# Patient Record
Sex: Female | Born: 1980 | Race: White | Hispanic: No | Smoking: Former smoker
Health system: Southern US, Community
[De-identification: ages and names within clinical notes are randomized; demographics above are authoritative.]

## PROBLEM LIST (undated history)

## (undated) DIAGNOSIS — G43909 Migraine, unspecified, not intractable, without status migrainosus: Secondary | ICD-10-CM

## (undated) DIAGNOSIS — I7090 Unspecified atherosclerosis: Secondary | ICD-10-CM

## (undated) DIAGNOSIS — G4730 Sleep apnea, unspecified: Secondary | ICD-10-CM

## (undated) DIAGNOSIS — K219 Gastro-esophageal reflux disease without esophagitis: Secondary | ICD-10-CM

## (undated) HISTORY — DX: Unspecified atherosclerosis: I70.90

## (undated) HISTORY — DX: Migraine, unspecified, not intractable, without status migrainosus: G43.909

## (undated) HISTORY — PX: CHOLECYSTECTOMY: SHX55

## (undated) HISTORY — DX: Sleep apnea, unspecified: G47.30

## (undated) HISTORY — PX: TUBAL LIGATION: SHX77

## (undated) HISTORY — DX: Gastro-esophageal reflux disease without esophagitis: K21.9

---

## 2003-12-24 HISTORY — PX: CHOLECYSTECTOMY: SHX55

## 2004-12-23 HISTORY — PX: TUBAL LIGATION: SHX77

## 2016-07-18 DIAGNOSIS — K219 Gastro-esophageal reflux disease without esophagitis: Secondary | ICD-10-CM | POA: Insufficient documentation

## 2016-07-18 DIAGNOSIS — I10 Essential (primary) hypertension: Secondary | ICD-10-CM | POA: Insufficient documentation

## 2016-07-18 DIAGNOSIS — G43119 Migraine with aura, intractable, without status migrainosus: Secondary | ICD-10-CM

## 2016-07-18 DIAGNOSIS — N920 Excessive and frequent menstruation with regular cycle: Secondary | ICD-10-CM

## 2016-07-18 DIAGNOSIS — F17200 Nicotine dependence, unspecified, uncomplicated: Secondary | ICD-10-CM | POA: Insufficient documentation

## 2016-07-18 HISTORY — DX: Essential (primary) hypertension: I10

## 2016-07-18 HISTORY — DX: Excessive and frequent menstruation with regular cycle: N92.0

## 2016-07-18 HISTORY — DX: Migraine with aura, intractable, without status migrainosus: G43.119

## 2016-07-18 HISTORY — DX: Gastro-esophageal reflux disease without esophagitis: K21.9

## 2016-09-03 DIAGNOSIS — M5136 Other intervertebral disc degeneration, lumbar region: Secondary | ICD-10-CM | POA: Insufficient documentation

## 2016-09-03 DIAGNOSIS — M51369 Other intervertebral disc degeneration, lumbar region without mention of lumbar back pain or lower extremity pain: Secondary | ICD-10-CM | POA: Insufficient documentation

## 2016-09-03 HISTORY — DX: Other intervertebral disc degeneration, lumbar region: M51.36

## 2016-09-03 HISTORY — DX: Other intervertebral disc degeneration, lumbar region without mention of lumbar back pain or lower extremity pain: M51.369

## 2016-10-04 DIAGNOSIS — F419 Anxiety disorder, unspecified: Secondary | ICD-10-CM

## 2016-10-04 HISTORY — DX: Anxiety disorder, unspecified: F41.9

## 2019-04-20 ENCOUNTER — Telehealth: Payer: Self-pay | Admitting: *Deleted

## 2019-04-20 ENCOUNTER — Telehealth: Payer: Self-pay | Admitting: Cardiology

## 2019-04-20 NOTE — Telephone Encounter (Signed)

## 2019-04-20 NOTE — Telephone Encounter (Signed)
Virtual Visit Pre-Appointment Phone Call  "(Name), I am calling you today to discuss your upcoming appointment. We are currently trying to limit exposure to the virus that causes COVID-19 by seeing patients at home rather than in the office."  1. "What is the BEST phone number to call the day of the visit?" - include this in appointment notes  2. Do you have or have access to (through a family member/friend) a smartphone with video capability that we can use for your visit?" a. If yes - list this number in appt notes as cell (if different from BEST phone #) and list the appointment type as a VIDEO visit in appointment notes b. If no - list the appointment type as a PHONE visit in appointment notes  3. Confirm consent - "In the setting of the current Covid19 crisis, you are scheduled for a (phone or video) visit with your provider on (date) at (time).  Just as we do with many in-office visits, in order for you to participate in this visit, we must obtain consent.  If you'd like, I can send this to your mychart (if signed up) or email for you to review.  Otherwise, I can obtain your verbal consent now.  All virtual visits are billed to your insurance company just like a normal visit would be.  By agreeing to a virtual visit, we'd like you to understand that the technology does not allow for your provider to perform an examination, and thus may limit your provider's ability to fully assess your condition. If your provider identifies any concerns that need to be evaluated in person, we will make arrangements to do so.  Finally, though the technology is pretty good, we cannot assure that it will always work on either your or our end, and in the setting of a video visit, we may have to convert it to a phone-only visit.  In either situation, we cannot ensure that we have a secure connection.  Are you willing to proceed?" STAFF: Did the patient verbally acknowledge consent to telehealth visit? Document  YES/NO here: YES  4. Advise patient to be prepared - "Two hours prior to your appointment, go ahead and check your blood pressure, pulse, oxygen saturation, and your weight (if you have the equipment to check those) and write them all down. When your visit starts, your provider will ask you for this information. If you have an Apple Watch or Kardia device, please plan to have heart rate information ready on the day of your appointment. Please have a pen and paper handy nearby the day of the visit as well."  5. Give patient instructions for MyChart download to smartphone OR Doximity/Doxy.me as below if video visit (depending on what platform provider is using)  6. Inform patient they will receive a phone call 15 minutes prior to their appointment time (may be from unknown caller ID) so they should be prepared to answer    TELEPHONE CALL NOTE  Marie Cox has been deemed a candidate for a follow-up tele-health visit to limit community exposure during the Covid-19 pandemic. I spoke with the patient via phone to ensure availability of phone/video source, confirm preferred email & phone number, and discuss instructions and expectations.  I reminded Marie Cox to be prepared with any vital sign and/or heart rhythm information that could potentially be obtained via home monitoring, at the time of her visit. I reminded Marie Cox to expect a phone call prior to her visit.  Minette BrineLisa B Welch 04/20/2019 10:14 AM   INSTRUCTIONS FOR DOWNLOADING THE MYCHART APP TO SMARTPHONE  - The patient must first make sure to have activated MyChart and know their login information - If Apple, go to [employer] Store and type in MyChart in the search bar and download the app. If Android, ask patient to go to [employer] Play Store and type in HoffmanMyChart in the search bar and download the app. The app is free but as with any other app downloads, their phone may require them to verify saved payment information or  Apple/Android password.  - The patient will need to then log into the app with their MyChart username and password, and select [facility] as their healthcare provider to link the account. When it is time for your visit, go to the MyChart app, find appointments, and click Begin Video Visit. Be sure to Select Allow for your device to access the Microphone and Camera for your visit. You will then be connected, and your provider will be with you shortly.  **If they have any issues connecting, or need assistance please contact MyChart service desk [phone])**  **If using a computer, in order to ensure the best quality for their visit they will need to use either of the following Internet Browsers: [employer] Edge, or Google Chrome**  IF USING DOXIMITY or DOXY.ME - The patient will receive a link just prior to their visit by text.     FULL LENGTH CONSENT FOR TELE-HEALTH VISIT   I hereby voluntarily request, consent and authorize [facility] and its employed or contracted physicians, physician assistants, nurse practitioners or other licensed health care professionals (the Practitioner), to provide me with telemedicine health care services (the Services") as deemed necessary by the treating Practitioner. I acknowledge and consent to receive the Services by the Practitioner via telemedicine. I understand that the telemedicine visit will involve communicating with the Practitioner through live audiovisual communication technology and the disclosure of certain medical information by electronic transmission. I acknowledge that I have been given the opportunity to request an in-person assessment or other available alternative prior to the telemedicine visit and am voluntarily participating in the telemedicine visit.  I understand that I have the right to withhold or withdraw my consent to the use of telemedicine in the course of my care at any time, without affecting my right to future care  or treatment, and that the Practitioner or I may terminate the telemedicine visit at any time. I understand that I have the right to inspect all information obtained and/or recorded in the course of the telemedicine visit and may receive copies of available information for a reasonable fee.  I understand that some of the potential risks of receiving the Services via telemedicine include:   Delay or interruption in medical evaluation due to technological equipment failure or disruption;  Information transmitted may not be sufficient (e.g. poor resolution of images) to allow for appropriate medical decision making by the Practitioner; and/or   In rare instances, security protocols could fail, causing a breach of personal health information.  Furthermore, I acknowledge that it is my responsibility to provide information about my medical history, conditions and care that is complete and accurate to the best of my ability. I acknowledge that Practitioner's advice, recommendations, and/or decision may be based on factors not within their control, such as incomplete or inaccurate data provided by me or distortions of diagnostic images or specimens that may result from electronic transmissions. I understand that the  practice of medicine is not an Chief Strategy Officer and that Practitioner makes no warranties or guarantees regarding treatment outcomes. I acknowledge that I will receive a copy of this consent concurrently upon execution via email to the email address I last provided but may also request a printed copy by calling the office of [facility].    I understand that my insurance will be billed for this visit.   I have read or had this consent read to me.  I understand the contents of this consent, which adequately explains the benefits and risks of the Services being provided via telemedicine.   I have been provided ample opportunity to ask questions regarding this consent and the Services and have had  my questions answered to my satisfaction.  I give my informed consent for the services to be provided through the use of telemedicine in my medical care  By participating in this telemedicine visit I agree to the above.

## 2019-04-22 ENCOUNTER — Other Ambulatory Visit: Payer: Self-pay

## 2019-04-22 ENCOUNTER — Encounter: Payer: Self-pay | Admitting: Cardiology

## 2019-04-22 ENCOUNTER — Telehealth (INDEPENDENT_AMBULATORY_CARE_PROVIDER_SITE_OTHER): Payer: Self-pay | Admitting: Cardiology

## 2019-04-22 VITALS — Ht 70.0 in | Wt 242.0 lb

## 2019-04-22 DIAGNOSIS — R079 Chest pain, unspecified: Secondary | ICD-10-CM

## 2019-04-22 DIAGNOSIS — I10 Essential (primary) hypertension: Secondary | ICD-10-CM

## 2019-04-22 DIAGNOSIS — F17200 Nicotine dependence, unspecified, uncomplicated: Secondary | ICD-10-CM

## 2019-04-22 DIAGNOSIS — Z01812 Encounter for preprocedural laboratory examination: Secondary | ICD-10-CM

## 2019-04-22 NOTE — Addendum Note (Signed)
Addended by: Pamala Hurry on: 04/22/2019 09:48 AM   Modules accepted: Orders

## 2019-04-22 NOTE — Progress Notes (Signed)
Addendum: In view of her chest discomfort symptoms I will set her up for treadmill stress test in 6 to 8 weeks.  I do not see any rush to do this in view of the corona pandemic.  Echocardiogram will be done in view of the low voltage EKG which may be simply because of her body habitus issues.

## 2019-04-22 NOTE — Progress Notes (Signed)
Virtual Visit via Video Note   This visit type was conducted due to national recommendations for restrictions regarding the COVID-19 Pandemic (e.g. social distancing) in an effort to limit this patient's exposure and mitigate transmission in our community.  Due to her co-morbid illnesses, this patient is at least at moderate risk for complications without adequate follow up.  This format is felt to be most appropriate for this patient at this time.  All issues noted in this document were discussed and addressed.  A limited physical exam was performed with this format.  Please refer to the patient's chart for her consent to telehealth for [facility].   Evaluation Performed:  Follow-up visit  Date:  04/22/2019   ID:  Marie Cox, Marie Cox 19-Sep-1981, MRN [id number]  Patient Location: Other:  work Provider Location: Home  PCP:  Baldo Ash, FNP  Cardiologist:  No primary care provider on file.  Electrophysiologist:  None   Chief Complaint:  Abnormal EKG  History of Present Illness:    Marie Cox is a 38 y.o. female with past medical history of essential hypertension.  She mentions to me now that her blood pressure is stable without any medications.  She has been experiencing some chest discomfort.  She mentions to me that it is more like a stabbing sensation at times at different parts of the chest and not related to exertion.  No orthopnea or PND.  She leads a sedentary lifestyle.  Sexual activity does not aggravate or bring around the symptoms.  At the time of my evaluation, the patient is alert awake oriented and in no distress.  The patient does not have symptoms concerning for COVID-19 infection (fever, chills, cough, or new shortness of breath).    Past Medical History:  Diagnosis Date  . Migraines    Past Surgical History:  Procedure Laterality Date  . CHOLECYSTECTOMY    . TUBAL LIGATION       No outpatient medications have been marked as taking for the  04/22/19 encounter (Telemedicine) with Yordin Rhoda, Aundra Dubin, MD.     Allergies:   Morphine and related   Social History   Tobacco Use  . Smoking status: Current Every Day Smoker    Types: Cigarettes  . Smokeless tobacco: Never Used  Substance Use Topics  . Alcohol use: Never    Frequency: Never  . Drug use: Never     Family Hx: The patient's family history includes Diabetes in her mother; Heart Problems in her father; Heart attack in her maternal grandfather and paternal grandmother; Hypertension in her mother; Lung cancer in her paternal grandfather.  ROS:   Please see the history of present illness.    As mentioned above All other systems reviewed and are negative.   Prior CV studies:   The following studies were reviewed today:  I reviewed records from primary care physician's office  Labs/Other Tests and Data Reviewed:    EKG:  An ECG dated April 11, 2019 was personally reviewed today and demonstrated:  Sinus rhythm and low voltage  Recent Labs: No results found for requested labs within last 8760 hours.   Recent Lipid Panel No results found for: CHOL, TRIG, HDL, CHOLHDL, LDLCALC, LDLDIRECT  Wt Readings from Last 3 Encounters:  04/22/19 242 lb (109.8 kg)     Objective:    Vital Signs:  Ht 5\' 10"  (1.778 m)   Wt 242 lb (109.8 kg)   BMI 34.72 kg/m    VITAL SIGNS:  reviewed  ASSESSMENT &  PLAN:    1. Chest discomfort: The patient's chest discomfort symptoms are atypical for coronary etiology.  Discussed this and reassured the patient about my findings.  I have asked her to walk 10 minutes a day at normal pace to do this.  If she has any symptoms she knows to stop.  She will go to the nearest emergency room for any concerning symptoms. 2. Essential hypertension: With lifestyle modification her blood pressure is stable 3. Cigarette smoking: I congratulated her about quitting 2 weeks ago.  I told her never to go back to smoking and she agrees 4. Overweight: Diet was  discussed losing weight stressed.  She vocalized understanding 5. Patient will be seen in follow-up appointment in 2 months or earlier if the patient has any concerns   COVID-19 Education: The signs and symptoms of COVID-19 were discussed with the patient and how to seek care for testing (follow up with PCP or arrange E-visit).  The importance of social distancing was discussed today.  Time:   Today, I have spent  minutes with the patient with telehealth technology discussing the above problems.     Medication Adjustments/Labs and Tests Ordered: Current medicines are reviewed at length with the patient today.  Concerns regarding medicines are outlined above.   Tests Ordered: No orders of the defined types were placed in this encounter.   Medication Changes: No orders of the defined types were placed in this encounter.   Disposition:  Follow up in 2 month(s)  Signed, Garwin Brothersajan R Franny Selvage, MD  04/22/2019 9:17 AM    [facility]

## 2019-04-22 NOTE — Patient Instructions (Addendum)
Medication Instructions:  Your physician recommends that you continue on your current medications as directed. Please refer to the Current Medication list given to you today.  If you need a refill on your cardiac medications before your next appointment, please call your pharmacy.   Lab work: NONE If you have labs (blood work) drawn today and your tests are completely normal, you will receive your results only by: Marland Kitchen MyChart Message (if you have MyChart) OR . A paper copy in the mail If you have any lab test that is abnormal or we need to change your treatment, we will call you to review the results.  Testing/Procedures: Your physician has requested that you have a stress echocardiogram. For further information please visit [email or web address] Please follow instruction sheet as given.  April 22, 2019      Marie Cox DOB: 1981/11/04 MRN: [id number] [address] [place] [address]   Dear [name],   You will be called to schedule an Exercise Stress Test  Please arrive 15 minutes prior to your appointment time for registration and insurance purposes.  The test will take approximately 45 minutes to complete.  How to prepare for your Exercise Stress Test: . Do bring a list of your current medications with you.  If not listed below, you may take your medications as normal. . Do wear comfortable clothes (no dresses or overalls) and walking shoes, tennis shoes preferred (no heels or open toed shoes are allowed) . Do Not wear cologne, perfume, aftershave or lotions (deodorant is allowed). . Please report to [address], [place], [place] for your test.  If these instructions are not followed, your test will have to be rescheduled.  If you have questions or concerns about your appointment, you can call [phone] If you cannot keep your appointment, please provide 24 hours notification to the Stress Lab, to avoid a possible $50 charge to your  account.  Follow-Up: At [facility], you and your health needs are our priority.  As part of our continuing mission to provide you with exceptional heart care, we have created designated Provider Care Teams.  These Care Teams include your primary Cardiologist (physician) and Advanced Practice Providers (APPs -  Physician Assistants and Nurse Practitioners) who all work together to provide you with the care you need, when you need it. You will need a follow up appointment in 2 months.    Any Other Special Instructions Will Be Listed Below    Exercise Stress Echocardiogram  An exercise stress echocardiogram is a test that checks how well your heart is working. For this test, you will walk on a treadmill to make your heart beat faster. This test uses sound waves (ultrasound) and a computer to make pictures (images) of your heart. These pictures will be taken before you exercise and after you exercise. What happens before the procedure?  Follow instructions from your doctor about what you cannot eat or drink before the test.  Do not drink or eat anything that has caffeine in it. Stop having caffeine for 24 hours before the test.  Ask your doctor about changing or stopping your normal medicines. This is important if you take diabetes medicines or blood thinners. Ask your doctor if you should take your medicines with water before the test.  If you use an inhaler, bring it to the test.  Do not use any products that have nicotine or tobacco in them, such as cigarettes and e-cigarettes. Stop using them for 4 hours  before the test. If you need help quitting, ask your doctor.  Wear comfortable shoes and clothing. What happens during the procedure?  You will be hooked up to a TV screen. Your doctor will watch the screen to see how fast your heart beats during the test.  Before you exercise, a computer will make a picture of your heart. To do this: ? A gel will be put on your chest. ? A wand  will be moved over the gel. ? Sound waves from the wand will go to the computer to make the picture.  Your will start walking on a treadmill. The treadmill will start at a slow speed. It will get faster a little bit at a time. When you walk faster, your heart will beat faster.  The treadmill will be stopped when your heart is working hard.  You will lie down right away so another picture of your heart can be taken.  The test will take 30-60 minutes. What happens after the procedure?  Your heart rate and blood pressure will be watched after the test.  If your doctor says that you can, you may: ? Eat what you usually eat. ? Do your normal activities. ? Take medicines like normal. Summary  An exercise stress echocardiogram is a test that checks how well your heart is working.  Follow instructions about what you cannot eat or drink before the test. Ask your doctor if you should take your normal medicines before the test.  Stop having caffeine for 24 hours before the test. Do not use anything with nicotine or tobacco in it for 4 hours before the test.  A computer will take a picture of your heart before you walk on a treadmill. It will take another picture when you are done walking.  Your heart rate and blood pressure will be watched after the test. This information is not intended to replace advice given to you by your health care provider. Make sure you discuss any questions you have with your health care provider. Document Released: 10/06/2009 Document Revised: 09/01/2016 Document Reviewed: 09/01/2016 Elsevier Interactive Patient Education  2019 [employer]

## 2019-06-01 ENCOUNTER — Telehealth: Payer: Self-pay | Admitting: Cardiology

## 2019-06-01 NOTE — Telephone Encounter (Signed)
Patient called and states that she was in [facility] and she has dome EKGS doen but no cardiac testing, she just wanted [place] to know, she is still pending a stress echo in the office.

## 2019-06-04 NOTE — Telephone Encounter (Signed)
Please change her stress echo to [place].  Please note that in a woman of childbearing age she will need pregnancy test evaluation before this test since it involves radiation.

## 2019-06-04 NOTE — Telephone Encounter (Signed)
Do you want patient to come in for HCG blood or urine pregnancy sample prior to lexi?

## 2019-06-04 NOTE — Addendum Note (Signed)
Addended by: Beckey Rutter on: 06/04/2019 05:03 PM   Modules accepted: Orders

## 2019-06-04 NOTE — Telephone Encounter (Signed)
Yes unless she has some form of contraception like intrauterine device

## 2019-06-07 NOTE — Telephone Encounter (Signed)
Urine is fine

## 2019-06-08 NOTE — Addendum Note (Signed)
Addended by: Beckey Rutter on: 06/08/2019 08:59 AM   Modules accepted: Orders

## 2019-06-09 ENCOUNTER — Telehealth: Payer: Self-pay | Admitting: *Deleted

## 2019-06-09 NOTE — Telephone Encounter (Signed)
Pt is anxious about the nuclear test and would like to wait until we can do treadmill if that is okay with you,. Please advise.

## 2019-06-09 NOTE — Telephone Encounter (Signed)
Please advise patient to walk on a daily basis and let us know if there are any symptoms.  Not to stress herself too much during walking just a regular walk.

## 2019-06-11 NOTE — Addendum Note (Signed)
Addended by: Beckey Rutter on: 06/11/2019 09:22 AM   Modules accepted: Orders

## 2019-06-11 NOTE — Telephone Encounter (Signed)
Patient states she has been walking daily and happy that she is back on waitlist for echo stress test. No concerns at this time, scheduled for f/u visit on 06/21/19 will discuss further with Dr. Docia Furl.

## 2019-06-21 ENCOUNTER — Other Ambulatory Visit: Payer: Self-pay

## 2019-06-21 ENCOUNTER — Telehealth: Payer: Self-pay | Admitting: Cardiology

## 2019-06-24 ENCOUNTER — Encounter: Payer: Self-pay | Admitting: Cardiology

## 2019-06-24 ENCOUNTER — Other Ambulatory Visit: Payer: Self-pay

## 2019-06-24 ENCOUNTER — Ambulatory Visit (INDEPENDENT_AMBULATORY_CARE_PROVIDER_SITE_OTHER): Payer: Self-pay | Admitting: Cardiology

## 2019-06-24 VITALS — BP 118/62 | HR 80 | Ht 70.0 in | Wt 235.0 lb

## 2019-06-24 DIAGNOSIS — R0789 Other chest pain: Secondary | ICD-10-CM

## 2019-06-24 DIAGNOSIS — R079 Chest pain, unspecified: Secondary | ICD-10-CM

## 2019-06-24 DIAGNOSIS — Z13220 Encounter for screening for lipoid disorders: Secondary | ICD-10-CM

## 2019-06-24 DIAGNOSIS — I10 Essential (primary) hypertension: Secondary | ICD-10-CM

## 2019-06-24 HISTORY — DX: Other chest pain: R07.89

## 2019-06-24 LAB — LIPID PANEL
Chol/HDL Ratio: 4 ratio (ref 0.0–4.4)
Cholesterol, Total: 167 mg/dL (ref 100–199)
HDL: 42 mg/dL (ref >39)
LDL Calculated: 85 mg/dL (ref 0–99)
Triglycerides: 202 mg/dL — ABNORMAL HIGH (ref 0–149)
VLDL Cholesterol Cal: 40 mg/dL (ref 5–40)

## 2019-06-24 NOTE — Addendum Note (Signed)
Addended by: Beckey Rutter on: 06/24/2019 08:46 AM   Modules accepted: Orders

## 2019-06-24 NOTE — Patient Instructions (Addendum)
Medication Instructions:  Your physician recommends that you continue on your current medications as directed. Please refer to the Current Medication list given to you today.  If you need a refill on your cardiac medications before your next appointment, please call your pharmacy.   Lab work: Your physician recommends that you have a lipid panel performed today.   If you have labs (blood work) drawn today and your tests are completely normal, you will receive your results only by: Marland Kitchen MyChart Message (if you have MyChart) OR . A paper copy in the mail If you have any lab test that is abnormal or we need to change your treatment, we will call you to review the results.  Testing/Procedures: You have been scheduled for CT calcium score to be performed at our [place]. THIS PROCEDURE IS $150 out of pocket!  Follow-Up: At [facility], you and your health needs are our priority.  As part of our continuing mission to provide you with exceptional heart care, we have created designated Provider Care Teams.  These Care Teams include your primary Cardiologist (physician) and Advanced Practice Providers (APPs -  Physician Assistants and Nurse Practitioners) who all work together to provide you with the care you need, when you need it. You will need a follow up appointment in 6 months.     Any Other Special Instructions Will Be Listed Below   Coronary Calcium Scan A coronary calcium scan is an imaging test used to look for deposits of calcium and other fatty materials (plaques) in the inner lining of the blood vessels of the heart (coronary arteries). These deposits of calcium and plaques can partly clog and narrow the coronary arteries without producing any symptoms or warning signs. This puts a person at risk for a heart attack. This test can detect these deposits before symptoms develop. Tell a health care provider about:  Any allergies you have.  All medicines you are  taking, including vitamins, herbs, eye drops, creams, and over-the-counter medicines.  Any problems you or family members have had with anesthetic medicines.  Any blood disorders you have.  Any surgeries you have had.  Any medical conditions you have.  Whether you are pregnant or may be pregnant. What are the risks? Generally, this is a safe procedure. However, problems may occur, including:  Harm to a pregnant woman and her unborn baby. This test involves the use of radiation. Radiation exposure can be dangerous to a pregnant woman and her unborn baby. If you are pregnant, you generally should not have this procedure done.  Slight increase in the risk of cancer. This is because of the radiation involved in the test. What happens before the procedure? No preparation is needed for this procedure. What happens during the procedure?   You will undress and remove any jewelry around your neck or chest.  You will put on a hospital gown.  Sticky electrodes will be placed on your chest. The electrodes will be connected to an electrocardiogram (ECG) machine to record a tracing of the electrical activity of your heart.  A CT scanner will take pictures of your heart. During this time, you will be asked to lie still and hold your breath for 2-3 seconds while a picture of your heart is being taken. The procedure may vary among health care providers and hospitals. What happens after the procedure?  You can get dressed.  You can return to your normal activities.  It is up to  you to get the results of your test. Ask your health care provider, or the department that is doing the test, when your results will be ready. Summary  A coronary calcium scan is an imaging test used to look for deposits of calcium and other fatty materials (plaques) in the inner lining of the blood vessels of the heart (coronary arteries).  Generally, this is a safe procedure. Tell your health care provider if you are  pregnant or may be pregnant.  No preparation is needed for this procedure.  A CT scanner will take pictures of your heart.  You can return to your normal activities after the scan is done. This information is not intended to replace advice given to you by your health care provider. Make sure you discuss any questions you have with your health care provider. Document Released: 06/06/2008 Document Revised: 11/21/2017 Document Reviewed: 10/28/2016 Elsevier Patient Education  2020 [employer]

## 2019-06-24 NOTE — Progress Notes (Signed)
Cardiology Office Note:    Date:  06/24/2019   ID:  Marie Cox, DOB Mar 13, 1981, MRN [id number]  PCP:  Sarajane Jews, FNP  Cardiologist:  Jenean Lindau, MD   Referring MD: Sarajane Jews, FNP    ASSESSMENT:    1. Chest discomfort    PLAN:    In order of problems listed above:  1. Chest discomfort: This is resolved.  She is living active lifestyle with good exercise and effort tolerance and has completely quit smoking.  I do not see the need for a stress test at this time.  I discussed this with her at extensive length.  Because of her concern of family history she is concerned about her risk for coronary artery disease and we will do a calcium score.  We will also check her lipids today. 2. I congratulated her about her weight loss.Patient will be seen in follow-up appointment in 6 months or earlier if the patient has any concerns    Medication Adjustments/Labs and Tests Ordered: Current medicines are reviewed at length with the patient today.  Concerns regarding medicines are outlined above.  No orders of the defined types were placed in this encounter.  No orders of the defined types were placed in this encounter.    No chief complaint on file.    History of Present Illness:    Marie Cox is a 38 y.o. female.  Patient was evaluated by me for chest discomfort.  Fortunately she has quit smoking and she tells me that she walks 15 minutes a day twice a day at least without any problems.  No chest pain orthopnea or PND.  She is very happy about it.  She is very proactive with diet and has lost weight.  Her blood pressure is now stable.  She is not on any medications.  Past Medical History:  Diagnosis Date  . Migraines     Past Surgical History:  Procedure Laterality Date  . CHOLECYSTECTOMY    . TUBAL LIGATION      Current Medications: No outpatient medications have been marked as taking for the 06/24/19 encounter (Office Visit) with Elenor Wildes, Reita Cliche,  MD.     Allergies:   Morphine and related   Social History   Socioeconomic History  . Marital status: Single    Spouse name: Not on file  . Number of children: Not on file  . Years of education: Not on file  . Highest education level: Not on file  Occupational History  . Not on file  Social Needs  . Financial resource strain: Not on file  . Food insecurity    Worry: Not on file    Inability: Not on file  . Transportation needs    Medical: Not on file    Non-medical: Not on file  Tobacco Use  . Smoking status: Current Every Day Smoker    Types: Cigarettes  . Smokeless tobacco: Never Used  Substance and Sexual Activity  . Alcohol use: Never    Frequency: Never  . Drug use: Never  . Sexual activity: Not on file  Lifestyle  . Physical activity    Days per week: Not on file    Minutes per session: Not on file  . Stress: Not on file  Relationships  . Social Herbalist on phone: Not on file    Gets together: Not on file    Attends religious service: Not on file    Active member of club or  organization: Not on file    Attends meetings of clubs or organizations: Not on file    Relationship status: Not on file  Other Topics Concern  . Not on file  Social History Narrative  . Not on file     Family History: The patient's family history includes Diabetes in her mother; Heart Problems in her father; Heart attack in her maternal grandfather and paternal grandmother; Hypertension in her mother; Lung cancer in her paternal grandfather.  ROS:   Please see the history of present illness.    All other systems reviewed and are negative.  EKGs/Labs/Other Studies Reviewed:    The following studies were reviewed today: [facility] and I reviewed records extensively.  EKG reveals sinus rhythm and nonspecific ST-T segment changes and labs were also reviewed   Recent Labs: No results found for requested labs within last 8760 hours.  Recent Lipid  Panel No results found for: CHOL, TRIG, HDL, CHOLHDL, VLDL, LDLCALC, LDLDIRECT  Physical Exam:    VS:  BP 118/62 (BP Location: Left Arm, Patient Position: Sitting, Cuff Size: Normal)   Pulse 80   Ht 5\' 10"  (1.778 m)   Wt 235 lb (106.6 kg)   SpO2 99%   BMI 33.72 kg/m     Wt Readings from Last 3 Encounters:  06/24/19 235 lb (106.6 kg)  04/22/19 242 lb (109.8 kg)     GEN: Patient is in no acute distress HEENT: Normal NECK: No JVD; No carotid bruits LYMPHATICS: No lymphadenopathy CARDIAC: Hear sounds regular, 2/6 systolic murmur at the apex. RESPIRATORY:  Clear to auscultation without rales, wheezing or rhonchi  ABDOMEN: Soft, non-tender, non-distended MUSCULOSKELETAL:  No edema; No deformity  SKIN: Warm and dry NEUROLOGIC:  Alert and oriented x 3 PSYCHIATRIC:  Normal affect   Signed, Garwin Brothersajan R Tihanna Goodson, MD  06/24/2019 8:35 AM    [facility]

## 2019-07-22 ENCOUNTER — Telehealth: Payer: Self-pay | Admitting: Cardiology

## 2019-07-22 DIAGNOSIS — I10 Essential (primary) hypertension: Secondary | ICD-10-CM

## 2019-07-22 DIAGNOSIS — Z13220 Encounter for screening for lipoid disorders: Secondary | ICD-10-CM

## 2019-07-22 NOTE — Telephone Encounter (Signed)
Lab results relayed, repeat labs ordered to be drawn in Dec. 2020. No further questions at this time.

## 2019-07-22 NOTE — Telephone Encounter (Signed)
Please call with lab results 

## 2019-07-22 NOTE — Telephone Encounter (Signed)
Copy of 06/24/19 lipid results sent to Dr. Rockne Coons per Dr. Docia Furl request.

## 2019-07-28 ENCOUNTER — Other Ambulatory Visit: Payer: Self-pay

## 2019-07-28 ENCOUNTER — Ambulatory Visit (INDEPENDENT_AMBULATORY_CARE_PROVIDER_SITE_OTHER)
Admission: RE | Admit: 2019-07-28 | Discharge: 2019-07-28 | Disposition: A | Discharge status: Home / Self Care | Payer: Self-pay | Source: Ambulatory Visit | Attending: Cardiology | Admitting: Cardiology

## 2019-07-28 DIAGNOSIS — R079 Chest pain, unspecified: Secondary | ICD-10-CM

## 2019-09-03 ENCOUNTER — Telehealth: Payer: Self-pay

## 2019-09-03 DIAGNOSIS — R0789 Other chest pain: Secondary | ICD-10-CM

## 2019-09-03 DIAGNOSIS — I10 Essential (primary) hypertension: Secondary | ICD-10-CM

## 2019-09-03 NOTE — Telephone Encounter (Signed)
-----   Message from Jenean Lindau, MD sent at 08/22/2019  7:28 PM EDT ----- Calcium score was low.  Continue diet and exercise.  I would like to get a lipid check in the next month or 2.  Probably after 2 months.  Nodule seen on the chest.  Most likely unremarkable however I would like this to be followed by the patient's primary care physician.  Please let patient know and also call the physician's nurse and let them know that they will be following this.  Please send him a copy of this report and document it. Jenean Lindau, MD 08/22/2019 7:27 PM

## 2019-09-03 NOTE — Addendum Note (Signed)
Addended by: Beckey Rutter on: 09/03/2019 10:17 AM   Modules accepted: Orders

## 2019-09-03 NOTE — Telephone Encounter (Signed)
Informed of nodule found on CT and that a copy of result was faxed so Dr. Hassell Done may assess need for further testing. RN called patient and relayed information. She is in agreement with diet/exercise and will come back in 2 mo for repeat labs. Aware of nodule and will touch base with Dr. Earlie Server office later today or Monday.

## 2019-11-12 LAB — HEPATIC FUNCTION PANEL
ALT: 16 IU/L (ref 0–32)
AST: 14 IU/L (ref 0–40)
Albumin: 4.2 g/dL (ref 3.8–4.8)
Alkaline Phosphatase: 76 IU/L (ref 39–117)
Bilirubin Total: 0.9 mg/dL (ref 0.0–1.2)
Bilirubin, Direct: 0.21 mg/dL (ref 0.00–0.40)
Total Protein: 6.6 g/dL (ref 6.0–8.5)

## 2019-11-12 LAB — LIPID PANEL
Chol/HDL Ratio: 3.5 ratio (ref 0.0–4.4)
Cholesterol, Total: 185 mg/dL (ref 100–199)
HDL: 53 mg/dL (ref >39)
LDL Chol Calc (NIH): 113 mg/dL — ABNORMAL HIGH (ref 0–99)
Triglycerides: 104 mg/dL (ref 0–149)
VLDL Cholesterol Cal: 19 mg/dL (ref 5–40)

## 2019-11-23 DIAGNOSIS — R918 Other nonspecific abnormal finding of lung field: Secondary | ICD-10-CM | POA: Insufficient documentation

## 2019-11-23 HISTORY — DX: Other nonspecific abnormal finding of lung field: R91.8

## 2019-12-10 ENCOUNTER — Ambulatory Visit (INDEPENDENT_AMBULATORY_CARE_PROVIDER_SITE_OTHER): Payer: Self-pay | Admitting: Cardiology

## 2019-12-10 ENCOUNTER — Other Ambulatory Visit: Payer: Self-pay

## 2019-12-10 ENCOUNTER — Encounter: Payer: Self-pay | Admitting: Cardiology

## 2019-12-10 DIAGNOSIS — E785 Hyperlipidemia, unspecified: Secondary | ICD-10-CM | POA: Insufficient documentation

## 2019-12-10 DIAGNOSIS — Z87891 Personal history of nicotine dependence: Secondary | ICD-10-CM

## 2019-12-10 DIAGNOSIS — I2510 Atherosclerotic heart disease of native coronary artery without angina pectoris: Secondary | ICD-10-CM

## 2019-12-10 DIAGNOSIS — R931 Abnormal findings on diagnostic imaging of heart and coronary circulation: Secondary | ICD-10-CM | POA: Insufficient documentation

## 2019-12-10 DIAGNOSIS — I2584 Coronary atherosclerosis due to calcified coronary lesion: Secondary | ICD-10-CM

## 2019-12-10 DIAGNOSIS — E782 Mixed hyperlipidemia: Secondary | ICD-10-CM | POA: Insufficient documentation

## 2019-12-10 HISTORY — DX: Abnormal findings on diagnostic imaging of heart and coronary circulation: R93.1

## 2019-12-10 HISTORY — DX: Mixed hyperlipidemia: E78.2

## 2019-12-10 HISTORY — DX: Personal history of nicotine dependence: Z87.891

## 2019-12-10 HISTORY — DX: Atherosclerotic heart disease of native coronary artery without angina pectoris: I25.10

## 2019-12-10 MED ORDER — ATORVASTATIN CALCIUM 10 MG PO TABS: 10.0000 mg | ORAL_TABLET | Freq: Every day | ORAL | 2 refills | Status: DC

## 2019-12-10 NOTE — Patient Instructions (Addendum)
Medication Instructions:  Your physician has recommended you make the following change in your medication:   START: ATORVASTATIN 10 MG DAILY  *If you need a refill on your cardiac medications before your next appointment, please call your pharmacy*  Lab Work: Your physician recommends that you return for FASTING lab work in [place]  BMET LFT's Lipids  If you have labs (blood work) drawn today and your tests are completely normal, you will receive your results only by: Marland Kitchen MyChart Message (if you have MyChart) OR . A paper copy in the mail If you have any lab test that is abnormal or we need to change your treatment, we will call you to review the results.  Testing/Procedures: None Ordered  Follow-Up: At [facility], you and your health needs are our priority.  As part of our continuing mission to provide you with exceptional heart care, we have created designated Provider Care Teams.  These Care Teams include your primary Cardiologist (physician) and Advanced Practice Providers (APPs -  Physician Assistants and Nurse Practitioners) who all work together to provide you with the care you need, when you need it.  Your next appointment:   6 month(s)  The format for your next appointment:   In Person  Provider:   Jyl Heinz, MD  Other Instructions

## 2019-12-10 NOTE — Progress Notes (Signed)
Cardiology Office Note:    Date:  12/10/2019   ID:  Marie Cox, DOB 10-14-1981, MRN [id number]  PCP:  Sarajane Jews, FNP  Cardiologist:  Jenean Lindau, MD   Referring MD: Sarajane Jews, FNP    ASSESSMENT:    1. Agatston coronary artery calcium score less than 100   2. Coronary artery calcification   3. Ex-smoker    PLAN:    In order of problems listed above:  1. Coronary artery calcification on calcium scoring CT scan: Secondary prevention stressed with the patient.  Importance of compliance with diet and medication stressed and she vocalized understanding.  Importance of regular exercise stressed and weight reduction stressed.  Details of exercise and diet outlined to her at length and she vocalized understanding and promises to do better. 2. Mild dyslipidemia: She will be initiated on statins atorvastatin 10 mg daily.  Diet was discussed.  Liver lipid check in 6 weeks. 3. Ex-smoker: She promises to never go back to smoking. 4. Patient will be seen in follow-up appointment in 6 months or earlier if the patient has any concerns    Medication Adjustments/Labs and Tests Ordered: Current medicines are reviewed at length with the patient today.  Concerns regarding medicines are outlined above.  No orders of the defined types were placed in this encounter.  No orders of the defined types were placed in this encounter.    Chief Complaint  Patient presents with  . Follow-up     History of Present Illness:    Marie Cox is a 38 y.o. female.  Patient has past medical history of essential hypertension which is better now without medications.  She has done lifestyle modifications.  She denies any chest pain orthopnea or PND.  She has coronary calcifications on CT scoring.  She denies any problems with exertion.  She is quit smoking in April and she is very happy about it.  At the time of my evaluation, the patient is alert awake oriented and in no  distress.  Past Medical History:  Diagnosis Date  . Migraines     Past Surgical History:  Procedure Laterality Date  . CHOLECYSTECTOMY    . TUBAL LIGATION      Current Medications: No outpatient medications have been marked as taking for the 12/10/19 encounter (Office Visit) with Renly Guedes, Reita Cliche, MD.     Allergies:   Morphine and related   Social History   Socioeconomic History  . Marital status: Single    Spouse name: Not on file  . Number of children: Not on file  . Years of education: Not on file  . Highest education level: Not on file  Occupational History  . Not on file  Tobacco Use  . Smoking status: Former Smoker    Types: Cigarettes    Start date: 04/09/2019  . Smokeless tobacco: Never Used  Substance and Sexual Activity  . Alcohol use: Never  . Drug use: Never  . Sexual activity: Not on file  Other Topics Concern  . Not on file  Social History Narrative  . Not on file   Social Determinants of Health   Financial Resource Strain:   . Difficulty of Paying Living Expenses: Not on file  Food Insecurity:   . Worried About Charity fundraiser in the Last Year: Not on file  . Ran Out of Food in the Last Year: Not on file  Transportation Needs:   . Lack of Transportation (Medical): Not on file  .  Lack of Transportation (Non-Medical): Not on file  Physical Activity:   . Days of Exercise per Week: Not on file  . Minutes of Exercise per Session: Not on file  Stress:   . Feeling of Stress : Not on file  Social Connections:   . Frequency of Communication with Friends and Family: Not on file  . Frequency of Social Gatherings with Friends and Family: Not on file  . Attends Religious Services: Not on file  . Active Member of Clubs or Organizations: Not on file  . Attends Banker Meetings: Not on file  . Marital Status: Not on file     Family History: The patient's family history includes Diabetes in her mother; Heart Problems in her father;  Heart attack in her maternal grandfather and paternal grandmother; Hypertension in her mother; Lung cancer in her paternal grandfather.  ROS:   Please see the history of present illness.    All other systems reviewed and are negative.  EKGs/Labs/Other Studies Reviewed:    The following studies were reviewed today: *CT CARDIAC SCORING (Accession [id number]) (Order [id number]) Imaging Date: 06/24/2019 Department: [facility]  Ordering/Authorizing: Amerah Puleo, Aundra Dubin, MD  Exam Status  Status  Final [99]  PACS Intelerad Image Link  Show images for CT CARDIAC SCORING  Addendum  ADDENDUM REPORT: 07/28/2019 12:16  CLINICAL DATA:  Risk stratification  EXAM: Coronary Calcium Score  TECHNIQUE: The patient was scanned on a Siemens Somatom 64 slice scanner. Axial non-contrast 3 mm slices were carried out through the heart. The data set was analyzed on a dedicated work station and scored using the Agatson method.  FINDINGS: Non-cardiac: See separate report from [facility].  Ascending aorta: Normal diameter 3.2 cm  Pericardium: Normal  Coronary arteries: One isolated area of calcium seen in mid LAD  IMPRESSION: Coronary calcium score of 7. This was 53 th percentile for age and sex matched control.  Charlton Haws   Electronically Signed   By: Charlton Haws M.D.   On: 07/28/2019 12:16       Recent Labs: 11/11/2019: ALT 16  Recent Lipid Panel    Component Value Date/Time   CHOL 185 11/11/2019 0836   TRIG 104 11/11/2019 0836   HDL 53 11/11/2019 0836   CHOLHDL 3.5 11/11/2019 0836   LDLCALC 113 (H) 11/11/2019 0836    Physical Exam:    VS:  BP 110/62   Pulse 87   Ht 5\' 10"  (1.778 m)   Wt 250 lb 3.2 oz (113.5 kg)   SpO2 99%   BMI 35.90 kg/m     Wt Readings from Last 3 Encounters:  12/10/19 250 lb 3.2 oz (113.5 kg)  06/24/19 235 lb (106.6 kg)  04/22/19 242 lb (109.8 kg)     GEN: Patient is in no acute distress HEENT:  Normal NECK: No JVD; No carotid bruits LYMPHATICS: No lymphadenopathy CARDIAC: Hear sounds regular, 2/6 systolic murmur at the apex. RESPIRATORY:  Clear to auscultation without rales, wheezing or rhonchi  ABDOMEN: Soft, non-tender, non-distended MUSCULOSKELETAL:  No edema; No deformity  SKIN: Warm and dry NEUROLOGIC:  Alert and oriented x 3 PSYCHIATRIC:  Normal affect   Signed, 04/24/19, MD  12/10/2019 8:32 AM    [facility]

## 2019-12-23 ENCOUNTER — Telehealth: Payer: Self-pay | Admitting: Cardiology

## 2020-04-19 IMAGING — CT CT HEART SCORING
2 series · 16 of 20 positions shown, 18 images · non-contrast
Comparison: Chest radiograph 05/13/2019.  No prior chest CT.
COMPARISON: Chest radiograph 05/13/2019.  No prior chest CT.

Addendum:
EXAM:
OVER-READ INTERPRETATION  CT CHEST

The following report is an over-read performed by radiologist Dr.
Wilzor Bayalkoti [REDACTED] on 07/28/2019. This over-read
does not include interpretation of cardiac or coronary anatomy or
pathology. The calcium score interpretation by the cardiologist is
attached.
CLINICAL DATA: Risk stratification
Coronary Calcium Score
TECHNIQUE: The patient was scanned on a Siemens Somatom 64 slice scanner. Axial
non-contrast 3 mm slices were carried out through the heart. The
data set was analyzed on a dedicated work station and scored using
the Agatson method.

[Series 2: casc 3.0 i36f 2 bestdiast 72 % · axial · 0.39mm/px · z∈[-240,-146]mm · 8 of 41 slices shown, 10 images]
[im 5/41  vessel]
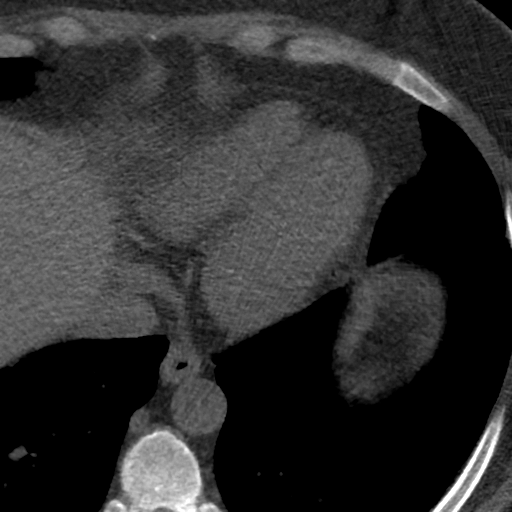
[im 5/41  lung]
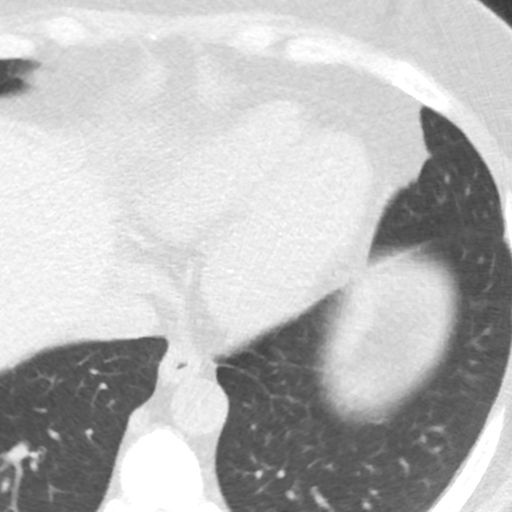
[im 9/41  vessel]
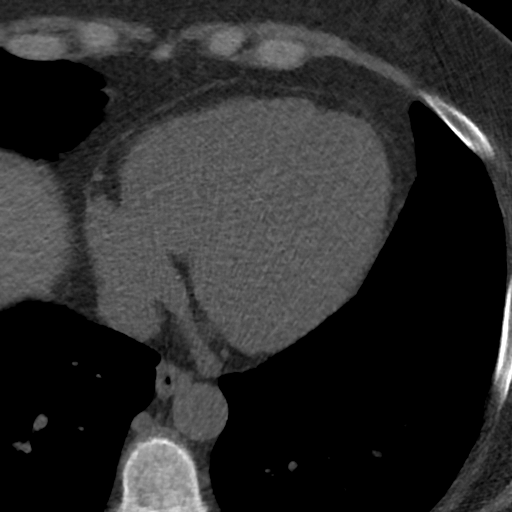
[im 14/41  vessel]
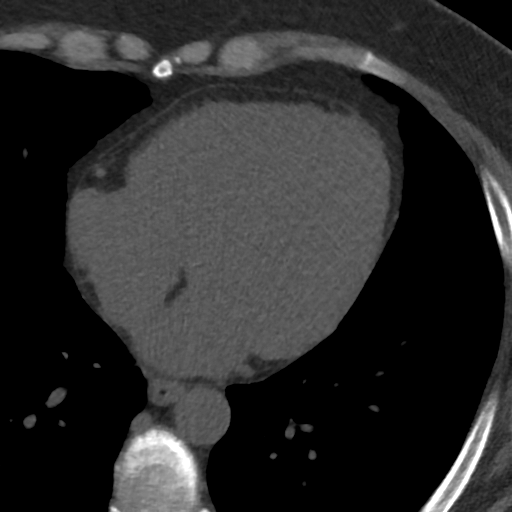
[im 18/41  vessel]
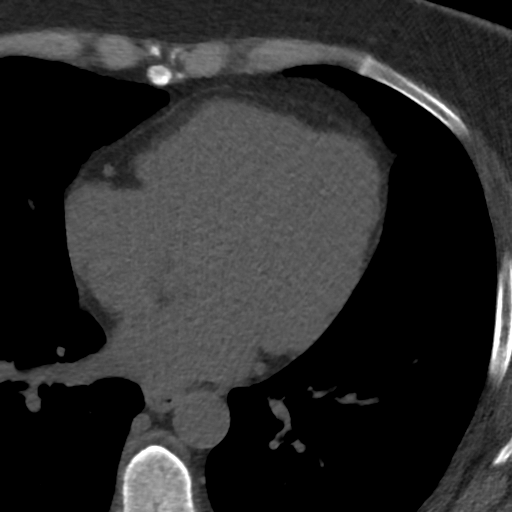
[im 23/41  vessel]
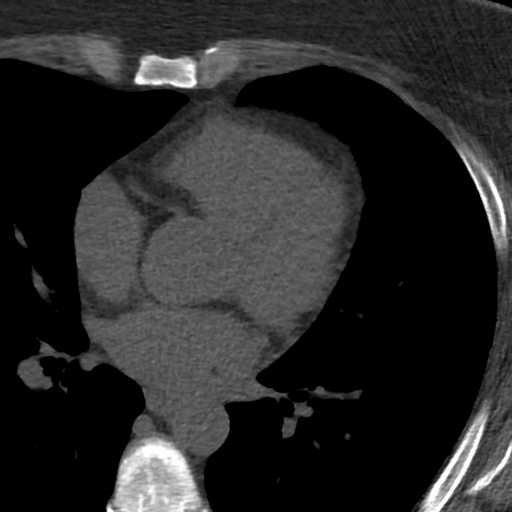
[im 23/41  lung]
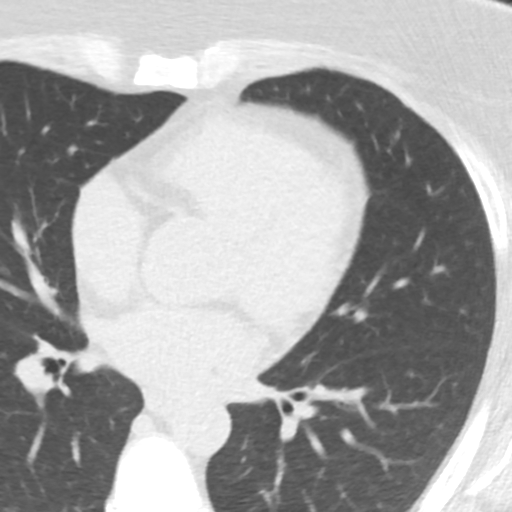
[im 27/41  vessel]
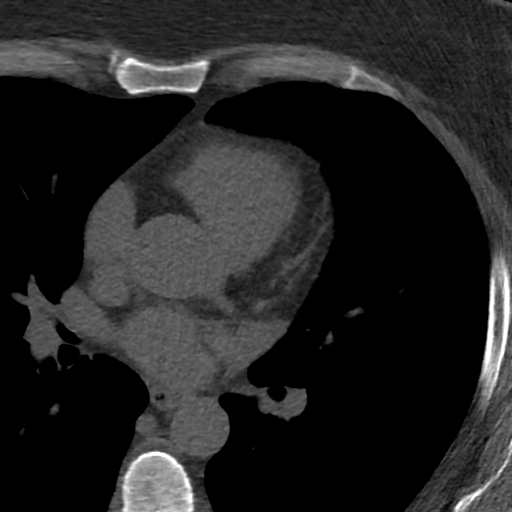
[im 32/41  vessel]
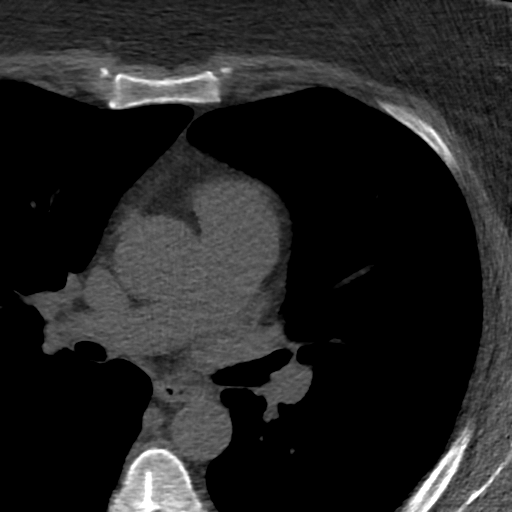
[im 36/41  vessel]
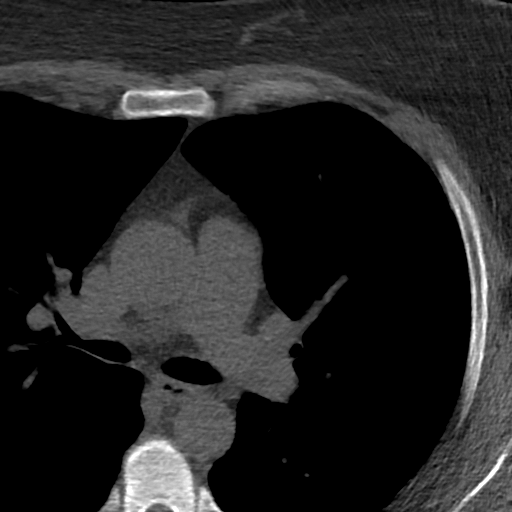

[Series 4: lung st 72 % · axial · 0.68mm/px · z∈[-240,-146]mm · 8 of 41 slices shown]
[im 5/41  lung]
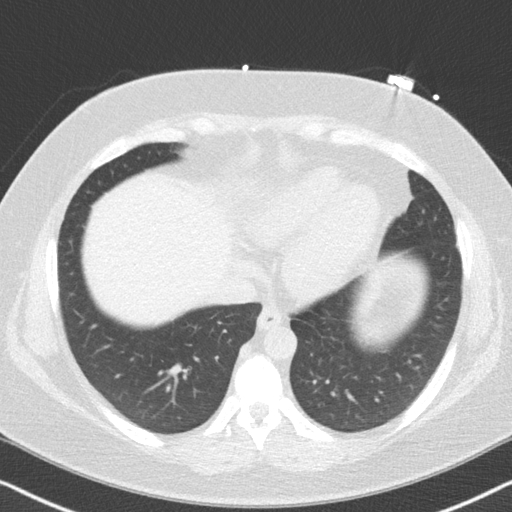
[im 9/41  lung]
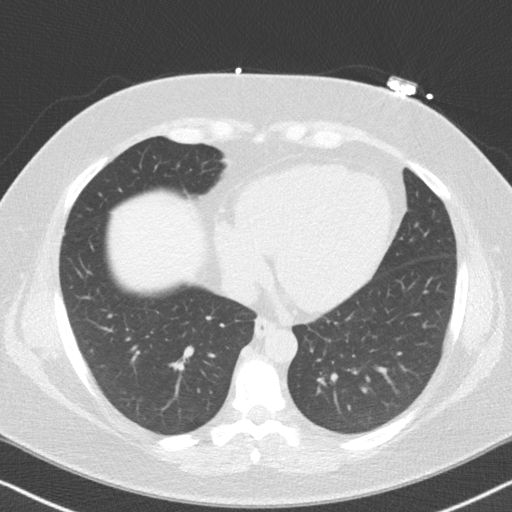
[im 14/41  lung]
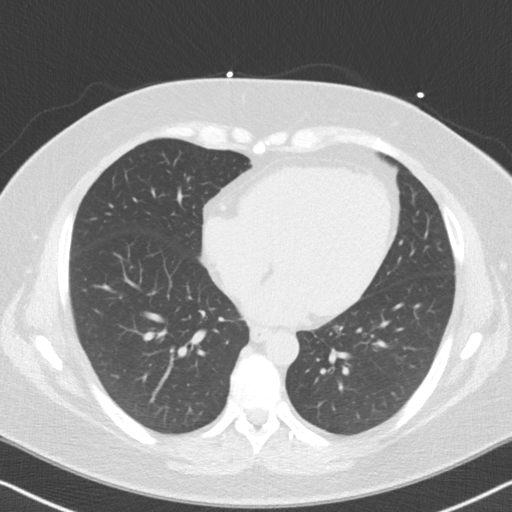
[im 18/41  lung]
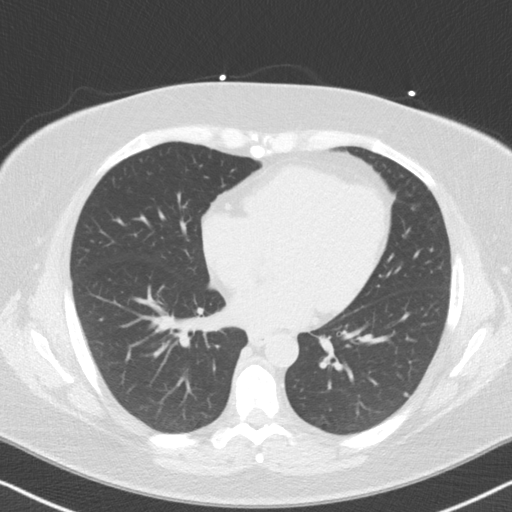
[im 23/41  lung]
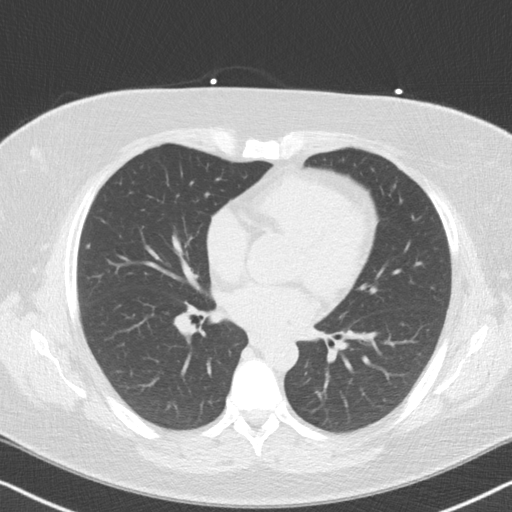
[im 27/41  lung]
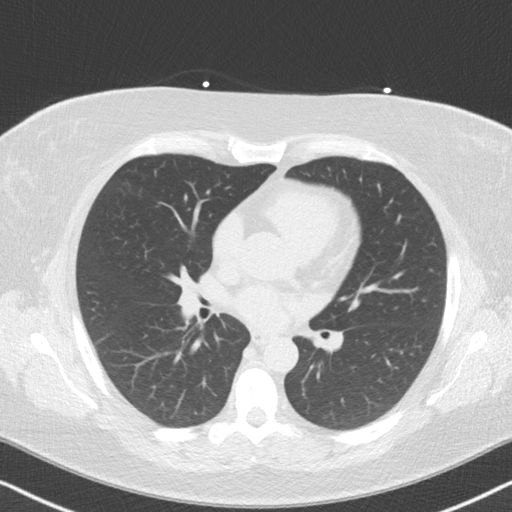
[im 32/41  lung]
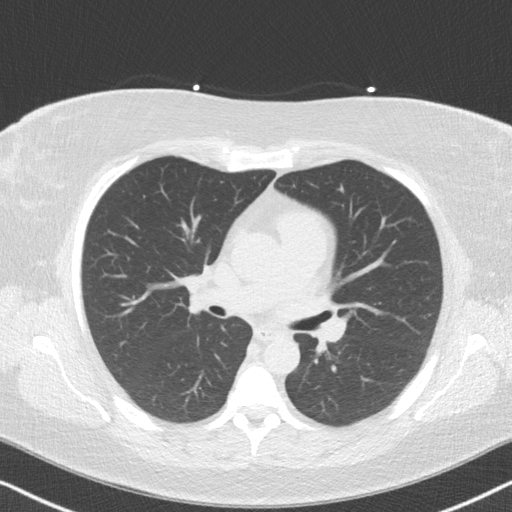
[im 36/41  lung]
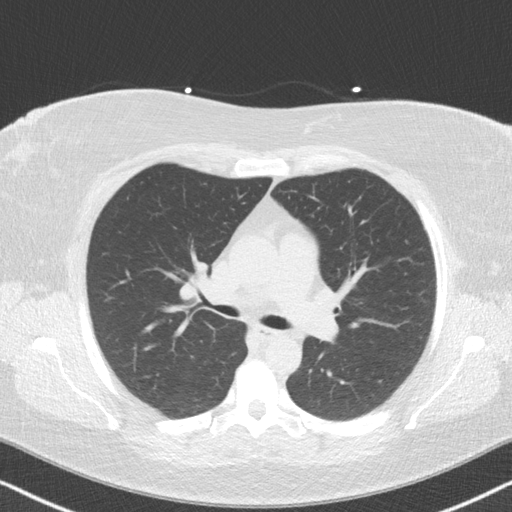

[16 of 20 positions shown; findings below may reference images not displayed]

FINDINGS: Vascular: Normal caliber of the aorta and branch vessels.

Mediastinum/Nodes: No imaged thoracic adenopathy.

Lungs/Pleura: No pleural fluid. Left lower lobe pulmonary nodule
measures 3 mm on [DATE].

Right middle lobe vague pulmonary nodule of 5 mm on image [DATE].

Upper Abdomen: Normal imaged portions of the liver, spleen.

Musculoskeletal: Mild midthoracic spondylosis.
IMPRESSION: 1.  No acute findings in the imaged extracardiac chest.
2. Bilateral pulmonary nodules of maximally 5 mm. No follow-up
needed if patient is low-risk. Non-contrast chest CT can be
considered in 12 months if patient is high-risk. This recommendation
follows the consensus statement: Guidelines for Management of
Incidental Pulmonary Nodules Detected on CT Images: From the
FINDINGS: Non-cardiac: See separate report from [REDACTED].

Ascending aorta: Normal diameter 3.2 cm

Pericardium: Normal

Coronary arteries: One isolated area of calcium seen in mid LAD
IMPRESSION: Coronary calcium score of 7. This was 96 th percentile for age and
sex matched control.

Gary La Luz Del Medranda

*** End of Addendum ***
EXAM:
OVER-READ INTERPRETATION  CT CHEST

The following report is an over-read performed by radiologist Dr.
Wilzor Bayalkoti [REDACTED] on 07/28/2019. This over-read
does not include interpretation of cardiac or coronary anatomy or
pathology. The calcium score interpretation by the cardiologist is
attached.
FINDINGS: Vascular: Normal caliber of the aorta and branch vessels.

Mediastinum/Nodes: No imaged thoracic adenopathy.

Lungs/Pleura: No pleural fluid. Left lower lobe pulmonary nodule
measures 3 mm on [DATE].

Right middle lobe vague pulmonary nodule of 5 mm on image [DATE].

Upper Abdomen: Normal imaged portions of the liver, spleen.

Musculoskeletal: Mild midthoracic spondylosis.
IMPRESSION: 1.  No acute findings in the imaged extracardiac chest.
2. Bilateral pulmonary nodules of maximally 5 mm. No follow-up
needed if patient is low-risk. Non-contrast chest CT can be
considered in 12 months if patient is high-risk. This recommendation
follows the consensus statement: Guidelines for Management of
Incidental Pulmonary Nodules Detected on CT Images: From the

## 2020-06-07 ENCOUNTER — Ambulatory Visit: Payer: Self-pay | Admitting: Cardiology

## 2020-06-08 ENCOUNTER — Encounter: Payer: Self-pay | Admitting: Cardiology

## 2020-06-08 ENCOUNTER — Ambulatory Visit (INDEPENDENT_AMBULATORY_CARE_PROVIDER_SITE_OTHER): Payer: Self-pay | Admitting: Cardiology

## 2020-06-08 ENCOUNTER — Other Ambulatory Visit: Payer: Self-pay

## 2020-06-08 VITALS — BP 110/94 | HR 90 | Ht 70.0 in | Wt 258.0 lb

## 2020-06-08 DIAGNOSIS — I2510 Atherosclerotic heart disease of native coronary artery without angina pectoris: Secondary | ICD-10-CM

## 2020-06-08 DIAGNOSIS — I2584 Coronary atherosclerosis due to calcified coronary lesion: Secondary | ICD-10-CM

## 2020-06-08 DIAGNOSIS — Z87891 Personal history of nicotine dependence: Secondary | ICD-10-CM

## 2020-06-08 DIAGNOSIS — I10 Essential (primary) hypertension: Secondary | ICD-10-CM

## 2020-06-08 DIAGNOSIS — E782 Mixed hyperlipidemia: Secondary | ICD-10-CM

## 2020-06-08 LAB — TSH: TSH: 2.02 u[IU]/mL (ref 0.450–4.500)

## 2020-06-08 LAB — BASIC METABOLIC PANEL
BUN/Creatinine Ratio: 13 (ref 9–23)
BUN: 12 mg/dL (ref 6–24)
CO2: 22 mmol/L (ref 20–29)
Calcium: 9.6 mg/dL (ref 8.7–10.2)
Chloride: 102 mmol/L (ref 96–106)
Creatinine, Ser: 0.95 mg/dL (ref 0.57–1.00)
GFR calc Af Amer: 86 mL/min/{1.73_m2} (ref >59)
GFR calc non Af Amer: 75 mL/min/{1.73_m2} (ref >59)
Glucose: 104 mg/dL — ABNORMAL HIGH (ref 65–99)
Potassium: 4.5 mmol/L (ref 3.5–5.2)
Sodium: 139 mmol/L (ref 134–144)

## 2020-06-08 LAB — LIPID PANEL
Chol/HDL Ratio: 3.2 ratio (ref 0.0–4.4)
Cholesterol, Total: 146 mg/dL (ref 100–199)
HDL: 46 mg/dL (ref >39)
LDL Chol Calc (NIH): 73 mg/dL (ref 0–99)
Triglycerides: 156 mg/dL — ABNORMAL HIGH (ref 0–149)
VLDL Cholesterol Cal: 27 mg/dL (ref 5–40)

## 2020-06-08 LAB — HEPATIC FUNCTION PANEL
ALT: 18 IU/L (ref 0–32)
AST: 17 IU/L (ref 0–40)
Albumin: 4.3 g/dL (ref 3.8–4.8)
Alkaline Phosphatase: 88 IU/L (ref 48–121)
Bilirubin Total: 1.4 mg/dL — ABNORMAL HIGH (ref 0.0–1.2)
Bilirubin, Direct: 0.3 mg/dL (ref 0.00–0.40)
Total Protein: 7 g/dL (ref 6.0–8.5)

## 2020-06-08 NOTE — Progress Notes (Signed)
Cardiology Office Note:    Date:  06/08/2020   ID:  Marie Cox, DOB 1981-08-21, MRN [id number]  PCP:  Patient, No Pcp Per  Cardiologist:  Jenean Lindau, MD   Referring MD: Sarajane Jews, FNP    ASSESSMENT:    1. Coronary artery calcification   2. Ex-smoker   3. Mixed dyslipidemia    PLAN:    In order of problems listed above:  1. Coronary artery disease: Secondary prevention stressed with the patient.  Importance of compliance with diet medication stressed and she vocalized understanding.  Weight reduction was stressed and risk of obesity explained and she promises to do better 2. Mixed dyslipidemia: Diet was emphasized again she is fasting today and she will have complete blood work including lipids 3. Patient will be seen in follow-up appointment in 6 months or earlier if the patient has any concerns    Medication Adjustments/Labs and Tests Ordered: Current medicines are reviewed at length with the patient today.  Concerns regarding medicines are outlined above.  No orders of the defined types were placed in this encounter.  No orders of the defined types were placed in this encounter.    Chief Complaint  Patient presents with  . Follow-up     History of Present Illness:    Marie Cox is a 39 y.o. female.  Patient has past medical history of coronary artery disease, mixed dyslipidemia and overweight status.  She denies any problems at this time and takes care of activities of daily living.  She tells me that she walks about 2 miles 5 days a week without any problems.  At the time of my evaluation, the patient is alert awake oriented and in no distress.  She has been somewhat lax with the diet.  Past Medical History:  Diagnosis Date  . Migraines     Past Surgical History:  Procedure Laterality Date  . CHOLECYSTECTOMY    . TUBAL LIGATION      Current Medications: No outpatient medications have been marked as taking for the 06/08/20 encounter  (Office Visit) with Braylon Grenda, Reita Cliche, MD.     Allergies:   Morphine and related   Social History   Socioeconomic History  . Marital status: Single    Spouse name: Not on file  . Number of children: Not on file  . Years of education: Not on file  . Highest education level: Not on file  Occupational History  . Not on file  Tobacco Use  . Smoking status: Former Smoker    Types: Cigarettes    Start date: 04/09/2019  . Smokeless tobacco: Never Used  Substance and Sexual Activity  . Alcohol use: Never  . Drug use: Never  . Sexual activity: Not on file  Other Topics Concern  . Not on file  Social History Narrative  . Not on file   Social Determinants of Health   Financial Resource Strain:   . Difficulty of Paying Living Expenses:   Food Insecurity:   . Worried About Charity fundraiser in the Last Year:   . Arboriculturist in the Last Year:   Transportation Needs:   . Film/video editor (Medical):   Marland Kitchen Lack of Transportation (Non-Medical):   Physical Activity:   . Days of Exercise per Week:   . Minutes of Exercise per Session:   Stress:   . Feeling of Stress :   Social Connections:   . Frequency of Communication with Friends and Family:   .  Frequency of Social Gatherings with Friends and Family:   . Attends Religious Services:   . Active Member of Clubs or Organizations:   . Attends Banker Meetings:   Marland Kitchen Marital Status:      Family History: The patient's family history includes Diabetes in her mother; Heart Problems in her father; Heart attack in her maternal grandfather and paternal grandmother; Hypertension in her mother; Lung cancer in her paternal grandfather.  ROS:   Please see the history of present illness.    All other systems reviewed and are negative.  EKGs/Labs/Other Studies Reviewed:    The following studies were reviewed today: CLINICAL DATA:  Risk stratification  EXAM: Coronary Calcium Score  TECHNIQUE: The patient was  scanned on a Siemens Somatom 64 slice scanner. Axial non-contrast 3 mm slices were carried out through the heart. The data set was analyzed on a dedicated work station and scored using the Agatson method.  FINDINGS: Non-cardiac: See separate report from [facility].  Ascending aorta: Normal diameter 3.2 cm  Pericardium: Normal  Coronary arteries: One isolated area of calcium seen in mid LAD  IMPRESSION: Coronary calcium score of 7. This was 67 th percentile for age and sex matched control.  Charlton Haws   Electronically Signed   By: Charlton Haws M.D.   On: 07/28/2019 12:16   Recent Labs: 11/11/2019: ALT 16  Recent Lipid Panel    Component Value Date/Time   CHOL 185 11/11/2019 0836   TRIG 104 11/11/2019 0836   HDL 53 11/11/2019 0836   CHOLHDL 3.5 11/11/2019 0836   LDLCALC 113 (H) 11/11/2019 0836    Physical Exam:    VS:  BP (!) 110/94   Pulse 90   Ht 5\' 10"  (1.778 m)   Wt 258 lb (117 kg)   SpO2 98%   BMI 37.02 kg/m     Wt Readings from Last 3 Encounters:  06/08/20 258 lb (117 kg)  12/10/19 250 lb 3.2 oz (113.5 kg)  06/24/19 235 lb (106.6 kg)     GEN: Patient is in no acute distress HEENT: Normal NECK: No JVD; No carotid bruits LYMPHATICS: No lymphadenopathy CARDIAC: Hear sounds regular, 2/6 systolic murmur at the apex. RESPIRATORY:  Clear to auscultation without rales, wheezing or rhonchi  ABDOMEN: Soft, non-tender, non-distended MUSCULOSKELETAL:  No edema; No deformity  SKIN: Warm and dry NEUROLOGIC:  Alert and oriented x 3 PSYCHIATRIC:  Normal affect   Signed, 08/25/19, MD  06/08/2020 9:26 AM    [facility]

## 2020-06-08 NOTE — Patient Instructions (Signed)

## 2020-07-07 ENCOUNTER — Telehealth: Payer: Self-pay | Admitting: Cardiology

## 2020-07-07 NOTE — Telephone Encounter (Signed)
Left message for patient to return call.

## 2020-07-07 NOTE — Telephone Encounter (Signed)
Called patient scheduled her with Dr. Bing Matter next week. No further questions.

## 2020-07-07 NOTE — Telephone Encounter (Signed)
Called patient. She reports that she has been having intermittent chest pain for months now. When she has it it is on the left side and has radiated into her arm before. It is random when she has it.She doesn't take nitro as she doesn't have any. She has been to hospital for it before but is told its musculat. She denies any pain currently. Will consult with DOD.

## 2020-07-07 NOTE — Telephone Encounter (Signed)
Patient returning call.

## 2020-07-07 NOTE — Telephone Encounter (Signed)
I would work him into Dr. Tomie China as schedule you are likely going to have to double book at the first opportunity next week I do not think we need to send her to the emergency room today

## 2020-07-07 NOTE — Telephone Encounter (Signed)
New Message  Pt c/o of Chest Pain: STAT if CP now or developed within 24 hours  1. Are you having CP right now? No  2. Are you experiencing any other symptoms (ex. SOB, nausea, vomiting, sweating)? Racing heartbeat, SOB, Chest pain  3. How long have you been experiencing CP? A month  4. Is your CP continuous or coming and going? Coming and going  5. Have you taken Nitroglycerin? No ?

## 2020-07-07 NOTE — Telephone Encounter (Signed)
yes

## 2020-07-11 ENCOUNTER — Ambulatory Visit (INDEPENDENT_AMBULATORY_CARE_PROVIDER_SITE_OTHER): Payer: Self-pay | Admitting: Cardiology

## 2020-07-11 ENCOUNTER — Encounter: Payer: Self-pay | Admitting: Cardiology

## 2020-07-11 ENCOUNTER — Other Ambulatory Visit: Payer: Self-pay

## 2020-07-11 VITALS — BP 108/78 | HR 68 | Ht 70.0 in | Wt 248.0 lb

## 2020-07-11 DIAGNOSIS — R931 Abnormal findings on diagnostic imaging of heart and coronary circulation: Secondary | ICD-10-CM

## 2020-07-11 DIAGNOSIS — R0789 Other chest pain: Secondary | ICD-10-CM

## 2020-07-11 DIAGNOSIS — I10 Essential (primary) hypertension: Secondary | ICD-10-CM

## 2020-07-11 DIAGNOSIS — I2584 Coronary atherosclerosis due to calcified coronary lesion: Secondary | ICD-10-CM

## 2020-07-11 DIAGNOSIS — E782 Mixed hyperlipidemia: Secondary | ICD-10-CM

## 2020-07-11 DIAGNOSIS — I2510 Atherosclerotic heart disease of native coronary artery without angina pectoris: Secondary | ICD-10-CM

## 2020-07-11 NOTE — Patient Instructions (Signed)
Medication Instructions:  Your physician recommends that you continue on your current medications as directed. Please refer to the Current Medication list given to you today.  *If you need a refill on your cardiac medications before your next appointment, please call your pharmacy*   Lab Work: None If you have labs (blood work) drawn today and your tests are completely normal, you will receive your results only by: Marland Kitchen MyChart Message (if you have MyChart) OR . A paper copy in the mail If you have any lab test that is abnormal or we need to change your treatment, we will call you to review the results.   Testing/Procedures:   [facility] [address] [place], [place] [address] Phone:  [phone]    Please arrive 15 minutes prior to your appointment time for registration and insurance purposes.  The test will take approximately 3 to 4 hours to complete; you may bring reading material.  If someone comes with you to your appointment, they will need to remain in the main lobby due to limited space in the testing area. **If you are pregnant or breastfeeding, please notify the nuclear lab prior to your appointment**  How to prepare for your Myocardial Perfusion Test: . Do not eat or drink 3 hours prior to your test, except you may have water. . Do not consume products containing caffeine (regular or decaffeinated) 12 hours prior to your test. (ex: coffee, chocolate, sodas, tea). . Do bring a list of your current medications with you.  If not listed below, you may take your medications as normal. . Do wear comfortable clothes (no dresses or overalls) and walking shoes, tennis shoes preferred (No heels or open toe shoes are allowed). . Do NOT wear cologne, perfume, aftershave, or lotions (deodorant is allowed). . If these instructions are not followed, your test will have to be rescheduled.  Please report to [address] for your test.  If you have  questions or concerns about your appointment, you can call the [facility] Lab at [phone].  If you cannot keep your appointment, please provide 24 hours notification to the Nuclear Lab, to avoid a possible $50 charge to your account.    Follow-Up: At [facility], you and your health needs are our priority.  As part of our continuing mission to provide you with exceptional heart care, we have created designated Provider Care Teams.  These Care Teams include your primary Cardiologist (physician) and Advanced Practice Providers (APPs -  Physician Assistants and Nurse Practitioners) who all work together to provide you with the care you need, when you need it.  We recommend signing up for the patient portal called "MyChart".  Sign up information is provided on this After Visit Summary.  MyChart is used to connect with patients for Virtual Visits (Telemedicine).  Patients are able to view lab/test results, encounter notes, upcoming appointments, etc.  Non-urgent messages can be sent to your provider as well.   To learn more about what you can do with MyChart, go to [email or web address].    Your next appointment:   3 month(s)  The format for your next appointment:   In Person  Provider:   Gypsy Balsam, MD   Other Instructions

## 2020-07-11 NOTE — Progress Notes (Signed)
Cardiology Office Note:    Date:  07/11/2020   ID:  Marie Cox, DOB 23-Sep-1981, MRN [id number]  PCP:  Patient, No Pcp Per  Cardiologist:  Gypsy Balsam, MD    Referring MD: No ref. provider found   No chief complaint on file. I am having chest pain  History of Present Illness:    Marie Cox is a 39 y.o. female with past medical history significant for calcification of the coronary arteries, in 2020 August she did have coronary calcium score done which showed small discrete calcium deposit in mid LAD.  Her coronary calcium score was only 7.  She was very appropriate put on cholesterol-lowering medication form of Lipitor she has been doing quite well since that time but now she comes to our office with chest pain.  Apparently recently her father end up going to the hospital and he required some cardiac procedure done that scared her a lot and she would like to be checked make sure everything is fine.  The way she described pain is some uneasy sensation in the chest that can happen at any time.  It happened at rest, it does not happen with exercise.  She works for [employer] that you had a lot of walking a lot of carrying some heavy stuff she will get short of breath but no chest pain then.  Sensation when she developed last for few minutes she grade is between 4 and 7.  Coughing taking deep breath does not make any difference.  Pressing her chest wall today reproduce partially this pain.  Overall she is very worried that she may be having worsening of her problem she would like to be checked out.  Past Medical History:  Diagnosis Date  . Migraines     Past Surgical History:  Procedure Laterality Date  . CHOLECYSTECTOMY    . TUBAL LIGATION      Current Medications: Current Meds  Medication Sig  . atorvastatin (LIPITOR) 10 MG tablet Take 1 tablet (10 mg total) by mouth daily.  . norethindrone (MICRONOR) 0.35 MG tablet Take 1 tablet by mouth daily.     Allergies:    Clindamycin and Morphine and related   Social History   Socioeconomic History  . Marital status: Single    Spouse name: Not on file  . Number of children: Not on file  . Years of education: Not on file  . Highest education level: Not on file  Occupational History  . Not on file  Tobacco Use  . Smoking status: Former Smoker    Types: Cigarettes    Start date: 04/09/2019  . Smokeless tobacco: Never Used  Substance and Sexual Activity  . Alcohol use: Never  . Drug use: Never  . Sexual activity: Not on file  Other Topics Concern  . Not on file  Social History Narrative  . Not on file   Social Determinants of Health   Financial Resource Strain:   . Difficulty of Paying Living Expenses:   Food Insecurity:   . Worried About Programme researcher, broadcasting/film/video in the Last Year:   . Barista in the Last Year:   Transportation Needs:   . Freight forwarder (Medical):   Marland Kitchen Lack of Transportation (Non-Medical):   Physical Activity:   . Days of Exercise per Week:   . Minutes of Exercise per Session:   Stress:   . Feeling of Stress :   Social Connections:   . Frequency of Communication with  Friends and Family:   . Frequency of Social Gatherings with Friends and Family:   . Attends Religious Services:   . Active Member of Clubs or Organizations:   . Attends Banker Meetings:   Marland Kitchen Marital Status:      Family History: The patient's family history includes Diabetes in her mother; Heart Problems in her father; Heart attack in her maternal grandfather and paternal grandmother; Hypertension in her mother; Lung cancer in her paternal grandfather. ROS:   Please see the history of present illness.    All 14 point review of systems negative except as described per history of present illness  EKGs/Labs/Other Studies Reviewed:      Recent Labs: 06/08/2020: ALT 18; BUN 12; Creatinine, Ser 0.95; Potassium 4.5; Sodium 139; TSH 2.020  Recent Lipid Panel    Component Value  Date/Time   CHOL 146 06/08/2020 0941   TRIG 156 (H) 06/08/2020 0941   HDL 46 06/08/2020 0941   CHOLHDL 3.2 06/08/2020 0941   LDLCALC 73 06/08/2020 0941    Physical Exam:    VS:  BP 108/78 (BP Location: Left Arm, Patient Position: Sitting, Cuff Size: Normal)   Pulse 68   Ht 5\' 10"  (1.778 m)   Wt 248 lb (112.5 kg)   SpO2 97%   BMI 35.58 kg/m     Wt Readings from Last 3 Encounters:  07/11/20 248 lb (112.5 kg)  06/08/20 258 lb (117 kg)  12/10/19 250 lb 3.2 oz (113.5 kg)     GEN:  Well nourished, well developed in no acute distress HEENT: Normal NECK: No JVD; No carotid bruits LYMPHATICS: No lymphadenopathy CARDIAC: RRR, no murmurs, no rubs, no gallops RESPIRATORY:  Clear to auscultation without rales, wheezing or rhonchi  ABDOMEN: Soft, non-tender, non-distended MUSCULOSKELETAL:  No edema; No deformity  SKIN: Warm and dry LOWER EXTREMITIES: no swelling NEUROLOGIC:  Alert and oriented x 3 PSYCHIATRIC:  Normal affect   ASSESSMENT:    1. Essential hypertension   2. Chest discomfort   3. Coronary artery calcification   4. Agatston coronary artery calcium score less than 100   5. Mixed dyslipidemia    PLAN:    In order of problems listed above:  1. Chest discomfort which is atypical not related to exercise.  Knowing the fact that she does have small calcium deposit mid LAD I will schedule her to have a stress test to rule out inducible ischemia.  She is taking aspirin which I will continue.  She was told to go to the emergency room if pain is not relieved by rest. 2. Dyslipidemia: She is on Lipitor which I will continue.  I did review her K PN which showed LDL of 85 and HDL of 46. 3. Essential hypertension blood pressure seems to be well controlled we will continue present management.   Medication Adjustments/Labs and Tests Ordered: Current medicines are reviewed at length with the patient today.  Concerns regarding medicines are outlined above.  Orders Placed This  Encounter  Procedures  . MYOCARDIAL PERFUSION IMAGING  . EKG 12-Lead   Medication changes: No orders of the defined types were placed in this encounter.   Signed, 12/12/19, MD, [facility] 07/11/2020 10:03 AM    [facility]

## 2020-07-18 ENCOUNTER — Telehealth (HOSPITAL_COMMUNITY): Payer: Self-pay | Admitting: Radiology

## 2020-07-18 NOTE — Telephone Encounter (Signed)
Patient given detailed instructions per Myocardial Perfusion Study Information Sheet for the test on 07/25/2020 at 11:30. Patient notified to arrive 15 minutes early and that it is imperative to arrive on time for appointment to keep from having the test rescheduled.  If you need to cancel or reschedule your appointment, please call the office within 24 hours of your appointment. . Patient verbalized understanding.EHK

## 2020-07-25 ENCOUNTER — Ambulatory Visit (INDEPENDENT_AMBULATORY_CARE_PROVIDER_SITE_OTHER): Payer: Self-pay

## 2020-07-25 ENCOUNTER — Other Ambulatory Visit: Payer: Self-pay

## 2020-07-25 DIAGNOSIS — R0789 Other chest pain: Secondary | ICD-10-CM

## 2020-07-25 MED ORDER — REGADENOSON 0.4 MG/5ML IV SOLN
0.4000 mg | Freq: Once | INTRAVENOUS | Status: AC
Start: 2020-07-25 — End: 2020-07-25
  Administered 2020-07-25: 0.4 mg via INTRAVENOUS

## 2020-07-25 MED ORDER — TECHNETIUM TC 99M TETROFOSMIN IV KIT
32.0000 | PACK | Freq: Once | INTRAVENOUS | Status: AC | PRN
  Administered 2020-07-25: 32 via INTRAVENOUS

## 2020-07-26 ENCOUNTER — Ambulatory Visit: Payer: Self-pay

## 2020-07-26 LAB — MYOCARDIAL PERFUSION IMAGING
LV dias vol: 99 mL (ref 46–106)
LV sys vol: 42 mL
Peak HR: 142 {beats}/min
Rest HR: 84 {beats}/min
SDS: 3
SRS: 0
SSS: 3
TID: 0.89

## 2020-07-26 MED ORDER — TECHNETIUM TC 99M TETROFOSMIN IV KIT
32.0000 | PACK | Freq: Once | INTRAVENOUS | Status: AC | PRN
  Administered 2020-07-26: 32 via INTRAVENOUS

## 2020-07-26 MED ORDER — TECHNETIUM TC 99M TETROFOSMIN IV KIT: 10.1000 | PACK | Freq: Once | INTRAVENOUS | Status: DC | PRN

## 2020-07-26 MED ORDER — REGADENOSON 0.4 MG/5ML IV SOLN
0.4000 mg | Freq: Once | INTRAVENOUS | Status: AC
Start: 2020-07-26 — End: 2020-07-25
  Administered 2020-07-25: 0.4 mg via INTRAVENOUS

## 2020-07-26 MED ORDER — TECHNETIUM TC 99M TETROFOSMIN IV KIT
32.0000 | PACK | Freq: Once | INTRAVENOUS | Status: AC | PRN
Start: 2020-07-26 — End: 2020-07-25
  Administered 2020-07-25: 32.2 via INTRAVENOUS

## 2020-07-28 ENCOUNTER — Telehealth: Payer: Self-pay

## 2020-07-28 NOTE — Telephone Encounter (Signed)
-----   Message from Georgeanna Lea, MD sent at 07/27/2020  1:45 PM EDT ----- Nuclear stress test showed no evidence of ischemia

## 2020-07-28 NOTE — Telephone Encounter (Signed)
Spoke with patient regarding results and recommendation.  Patient verbalizes understanding and is agreeable to plan of care. Advised patient to call back with any issues or concerns.  

## 2020-08-24 ENCOUNTER — Ambulatory Visit (INDEPENDENT_AMBULATORY_CARE_PROVIDER_SITE_OTHER): Payer: 59 | Admitting: Family Medicine

## 2020-08-24 ENCOUNTER — Other Ambulatory Visit: Payer: Self-pay

## 2020-08-24 ENCOUNTER — Encounter: Payer: Self-pay | Admitting: Family Medicine

## 2020-08-24 VITALS — BP 118/78 | HR 70 | Temp 97.8°F | Ht 70.0 in | Wt 242.6 lb

## 2020-08-24 DIAGNOSIS — K219 Gastro-esophageal reflux disease without esophagitis: Secondary | ICD-10-CM

## 2020-08-24 DIAGNOSIS — Z1231 Encounter for screening mammogram for malignant neoplasm of breast: Secondary | ICD-10-CM

## 2020-08-24 DIAGNOSIS — R519 Headache, unspecified: Secondary | ICD-10-CM

## 2020-08-24 DIAGNOSIS — R918 Other nonspecific abnormal finding of lung field: Secondary | ICD-10-CM | POA: Diagnosis not present

## 2020-08-24 HISTORY — DX: Encounter for screening mammogram for malignant neoplasm of breast: Z12.31

## 2020-08-24 HISTORY — DX: Headache, unspecified: R51.9

## 2020-08-24 MED ORDER — OMEPRAZOLE 40 MG PO CPDR
40.0000 mg | DELAYED_RELEASE_CAPSULE | Freq: Every day | ORAL | 3 refills | Status: DC
Start: 2020-08-24 — End: 2020-12-20

## 2020-08-24 MED ORDER — ATORVASTATIN CALCIUM 10 MG PO TABS: 10.0000 mg | ORAL_TABLET | Freq: Every day | ORAL | 1 refills | Status: DC

## 2020-08-24 NOTE — Progress Notes (Signed)
New Patient Office Visit  Subjective:  Patient ID: Marie Cox, female    DOB: [birth year]  Age: 39 y.o. MRN: [id number]  CC:  Chief Complaint  Patient presents with  . Establish Care  GERD/lung nodules  HPI Marie Cox presents for lung nodules-pulmonary specialist following with CT in 12/21 for follow up Cardiology-ecg/stress test-cardiology-blockage -medical management GERD-took meds in the past-Tums and pepcid-took omeprazole in the past Headaches intermittently-uses ibuprofen  Past Medical History:  Diagnosis Date  . Migraines     Past Surgical History:  Procedure Laterality Date  . CHOLECYSTECTOMY    . TUBAL LIGATION      Family History  Problem Relation Age of Onset  . Diabetes Mother   . Hypertension Mother   . Heart attack Maternal Grandfather   . Heart Problems Father   . Heart attack Paternal Grandmother   . Lung cancer Paternal Grandfather     Social History   Socioeconomic History  . Marital status: Single    Spouse name: Not on file  . Number of children: Not on file  . Years of education: Not on file  . Highest education level: Not on file  Occupational History  . Not on file  Tobacco Use  . Smoking status: Former Smoker    Types: Cigarettes    Start date: 04/09/2019  . Smokeless tobacco: Never Used  Substance and Sexual Activity  . Alcohol use: Never  . Drug use: Never  . Sexual activity: Not on file  Other Topics Concern  . Not on file  Social History Narrative  . Not on file   Social Determinants of Health   Financial Resource Strain:   . Difficulty of Paying Living Expenses: Not on file  Food Insecurity:   . Worried About Programme researcher, broadcasting/film/video in the Last Year: Not on file  . Ran Out of Food in the Last Year: Not on file  Transportation Needs:   . Lack of Transportation (Medical): Not on file  . Lack of Transportation (Non-Medical): Not on file  Physical Activity:   . Days of Exercise per Week: Not on file  .  Minutes of Exercise per Session: Not on file  Stress:   . Feeling of Stress : Not on file  Social Connections:   . Frequency of Communication with Friends and Family: Not on file  . Frequency of Social Gatherings with Friends and Family: Not on file  . Attends Religious Services: Not on file  . Active Member of Clubs or Organizations: Not on file  . Attends Banker Meetings: Not on file  . Marital Status: Not on file  Intimate Partner Violence:   . Fear of Current or Ex-Partner: Not on file  . Emotionally Abused: Not on file  . Physically Abused: Not on file  . Sexually Abused: Not on file    ROS Review of Systems  Constitutional: Negative.   HENT: Negative.  Negative for congestion.   Eyes:       Glasses  Respiratory:       CT lung nodules-repeat CT in 12/21  Cardiovascular:       Ecg normal Stress test normal  Gastrointestinal:       No meds for reflux  Genitourinary:       Last pap smear-? Tubal ligation-HSG  Musculoskeletal: Negative.        Knee pain-bilat-walking-injury from keg falling on the knee and basketball in the past  Skin: Negative.   Allergic/Immunologic: Positive for  environmental allergies.       Claritin  Neurological: Positive for headaches.       1-2x/week Migraine q 3 weeks Trigger stress  Hematological: Negative.   Psychiatric/Behavioral: Negative.     Objective:   Today's Vitals: BP 118/78   Pulse 70   Temp 97.8 F (36.6 C)   Ht 5\' 10"  (1.778 m)   Wt 242 lb 9.6 oz (110 kg)   SpO2 99%   BMI 34.81 kg/m   Physical Exam Constitutional:      Appearance: Normal appearance.  HENT:     Head: Normocephalic and atraumatic.  Cardiovascular:     Rate and Rhythm: Normal rate and regular rhythm.     Pulses: Normal pulses.     Heart sounds: Normal heart sounds.  Pulmonary:     Effort: Pulmonary effort is normal.     Breath sounds: Normal breath sounds.  Musculoskeletal:     Cervical back: Normal range of motion and neck  supple.  Neurological:     Mental Status: She is alert and oriented to person, place, and time.  Psychiatric:        Mood and Affect: Mood normal.        Behavior: Behavior normal.     Assessment & Plan:  1. Gastroesophageal reflux disease without esophagitis Omeprazole -rx -restarted-pt education  2. Pulmonary nodules/lesions, multiple Pulmonary following-CT scheduled 12/31  3. Frequent headaches Uses ibuprofen-stress as a trigger  4. Encounter for screening mammogram for malignant neoplasm of breast No FH breast CA- - MM Digital Screening; Future  Outpatient Encounter Medications as of 08/24/2020  Medication Sig  . atorvastatin (LIPITOR) 10 MG tablet Take 1 tablet (10 mg total) by mouth daily.  . [DISCONTINUED] norethindrone (MICRONOR) 0.35 MG tablet Take 1 tablet by mouth daily.   No facility-administered encounter medications on file as of 08/24/2020.    Follow-up:  Syrita Dovel 10/24/2020, MD

## 2020-08-24 NOTE — Patient Instructions (Addendum)
GERD-omeprazole rx Gastroesophageal Reflux Disease, Adult Gastroesophageal reflux (GER) happens when acid from the stomach flows up into the tube that connects the mouth and the stomach (esophagus). Normally, food travels down the esophagus and stays in the stomach to be digested. With GER, food and stomach acid sometimes move back up into the esophagus. You may have a disease called gastroesophageal reflux disease (GERD) if the reflux:  Happens often.  Causes frequent or very bad symptoms.  Causes problems such as damage to the esophagus. When this happens, the esophagus becomes sore and swollen (inflamed). Over time, GERD can make small holes (ulcers) in the lining of the esophagus. What are the causes? This condition is caused by a problem with the muscle between the esophagus and the stomach. When this muscle is weak or not normal, it does not close properly to keep food and acid from coming back up from the stomach. The muscle can be weak because of:  Tobacco use.  Pregnancy.  Having a certain type of hernia (hiatal hernia).  Alcohol use.  Certain foods and drinks, such as coffee, chocolate, onions, and peppermint. What increases the risk? You are more likely to develop this condition if you:  Are overweight.  Have a disease that affects your connective tissue.  Use NSAID medicines. What are the signs or symptoms? Symptoms of this condition include:  Heartburn.  Difficult or painful swallowing.  The feeling of having a lump in the throat.  A bitter taste in the mouth.  Bad breath.  Having a lot of saliva.  Having an upset or bloated stomach.  Belching.  Chest pain. Different conditions can cause chest pain. Make sure you see your doctor if you have chest pain.  Shortness of breath or noisy breathing (wheezing).  Ongoing (chronic) cough or a cough at night.  Wearing away of the surface of teeth (tooth enamel).  Weight loss. How is this treated? Treatment  will depend on how bad your symptoms are. Your doctor may suggest:  Changes to your diet.  Medicine.  Surgery. Follow these instructions at home: Eating and drinking   Follow a diet as told by your doctor. You may need to avoid foods and drinks such as: ? Coffee and tea (with or without caffeine). ? Drinks that contain alcohol. ? Energy drinks and sports drinks. ? Bubbly (carbonated) drinks or sodas. ? Chocolate and cocoa. ? Peppermint and mint flavorings. ? Garlic and onions. ? Horseradish. ? Spicy and acidic foods. These include peppers, chili powder, curry powder, vinegar, hot sauces, and BBQ sauce. ? Citrus fruit juices and citrus fruits, such as oranges, lemons, and limes. ? Tomato-based foods. These include red sauce, chili, salsa, and pizza with red sauce. ? Fried and fatty foods. These include donuts, french fries, potato chips, and high-fat dressings. ? High-fat meats. These include hot dogs, rib eye steak, sausage, ham, and bacon. ? High-fat dairy items, such as whole milk, butter, and cream cheese.  Eat small meals often. Avoid eating large meals.  Avoid drinking large amounts of liquid with your meals.  Avoid eating meals during the 2-3 hours before bedtime.  Avoid lying down right after you eat.  Do not exercise right after you eat. Lifestyle   Do not use any products that contain nicotine or tobacco. These include cigarettes, e-cigarettes, and chewing tobacco. If you need help quitting, ask your doctor.  Try to lower your stress. If you need help doing this, ask your doctor.  If you are overweight, lose  an amount of weight that is healthy for you. Ask your doctor about a safe weight loss goal. General instructions  Pay attention to any changes in your symptoms.  Take over-the-counter and prescription medicines only as told by your doctor. Do not take aspirin, ibuprofen, or other NSAIDs unless your doctor says it is okay.  Wear loose clothes. Do not wear  anything tight around your waist.  Raise (elevate) the head of your bed about 6 inches (15 cm).  Avoid bending over if this makes your symptoms worse.  Keep all follow-up visits as told by your doctor. This is important. Contact a doctor if:  You have new symptoms.  You lose weight and you do not know why.  You have trouble swallowing or it hurts to swallow.  You have wheezing or a cough that keeps happening.  Your symptoms do not get better with treatment.  You have a hoarse voice. Get help right away if:  You have pain in your arms, neck, jaw, teeth, or back.  You feel sweaty, dizzy, or light-headed.  You have chest pain or shortness of breath.  You throw up (vomit) and your throw-up looks like blood or coffee grounds.  You pass out (faint).  Your poop (stool) is bloody or black.  You cannot swallow, drink, or eat. Summary  If a person has gastroesophageal reflux disease (GERD), food and stomach acid move back up into the esophagus and cause symptoms or problems such as damage to the esophagus.  Treatment will depend on how bad your symptoms are.  Follow a diet as told by your doctor.  Take all medicines only as told by your doctor. This information is not intended to replace advice given to you by your health care provider. Make sure you discuss any questions you have with your health care provider. Document Revised: 06/17/2018 Document Reviewed: 06/17/2018 Elsevier Patient Education  2020 [employer].  Food Choices for Gastroesophageal Reflux Disease, Adult When you have gastroesophageal reflux disease (GERD), the foods you eat and your eating habits are very important. Choosing the right foods can help ease your discomfort. Think about working with a nutrition specialist (dietitian) to help you make good choices. What are tips for following this plan?  Meals  Choose healthy foods that are low in fat, such as fruits, vegetables, whole grains, low-fat dairy  products, and lean meat, fish, and poultry.  Eat small meals often instead of 3 large meals a day. Eat your meals slowly, and in a place where you are relaxed. Avoid bending over or lying down until 2-3 hours after eating.  Avoid eating meals 2-3 hours before bed.  Avoid drinking a lot of liquid with meals.  Cook foods using methods other than frying. Bake, grill, or broil food instead.  Avoid or limit: ? Chocolate. ? Peppermint or spearmint. ? Alcohol. ? Pepper. ? Black and decaffeinated coffee. ? Black and decaffeinated tea. ? Bubbly (carbonated) soft drinks. ? Caffeinated energy drinks and soft drinks.  Limit high-fat foods such as: ? Fatty meat or fried foods. ? Whole milk, cream, butter, or ice cream. ? Nuts and nut butters. ? Pastries, donuts, and sweets made with butter or shortening.  Avoid foods that cause symptoms. These foods may be different for everyone. Common foods that cause symptoms include: ? Tomatoes. ? Oranges, lemons, and limes. ? Peppers. ? Spicy food. ? Onions and garlic. ? Vinegar. Lifestyle  Maintain a healthy weight. Ask your doctor what weight is healthy for you.  If you need to lose weight, work with your doctor to do so safely.  Exercise for at least 30 minutes for 5 or more days each week, or as told by your doctor.  Wear loose-fitting clothes.  Do not smoke. If you need help quitting, ask your doctor.  Sleep with the head of your bed higher than your feet. Use a wedge under the mattress or blocks under the bed frame to raise the head of the bed. Summary  When you have gastroesophageal reflux disease (GERD), food and lifestyle choices are very important in easing your symptoms.  Eat small meals often instead of 3 large meals a day. Eat your meals slowly, and in a place where you are relaxed.  Limit high-fat foods such as fatty meat or fried foods.  Avoid bending over or lying down until 2-3 hours after eating.  Avoid peppermint and  spearmint, caffeine, alcohol, and chocolate. This information is not intended to replace advice given to you by your health care provider. Make sure you discuss any questions you have with your health care provider. Document Revised: 04/01/2019 Document Reviewed: 01/14/2017 Elsevier Patient Education  2020 [employer].   miralax trial

## 2020-08-31 ENCOUNTER — Other Ambulatory Visit: Payer: Self-pay

## 2020-08-31 MED ORDER — CETIRIZINE HCL 10 MG PO TABS
10.0000 mg | ORAL_TABLET | Freq: Every day | ORAL | 11 refills | Status: DC
Start: 2020-08-31 — End: 2021-08-21

## 2020-08-31 NOTE — Telephone Encounter (Signed)
Patient called stating during her OV with Dr. Judee Clara that it was suggested that she start on Zyrtec however, she is requesting a refill since it is cheaper than purchasing over the counter.

## 2020-10-02 ENCOUNTER — Telehealth: Payer: Self-pay

## 2020-10-02 ENCOUNTER — Ambulatory Visit (INDEPENDENT_AMBULATORY_CARE_PROVIDER_SITE_OTHER): Payer: 59 | Admitting: Legal Medicine

## 2020-10-02 ENCOUNTER — Other Ambulatory Visit: Payer: Self-pay

## 2020-10-02 ENCOUNTER — Encounter: Payer: Self-pay | Admitting: Legal Medicine

## 2020-10-02 VITALS — BP 130/70 | HR 80 | Temp 97.8°F | Resp 16 | Ht 70.0 in | Wt 238.0 lb

## 2020-10-02 DIAGNOSIS — S83249A Other tear of medial meniscus, current injury, unspecified knee, initial encounter: Secondary | ICD-10-CM

## 2020-10-02 DIAGNOSIS — S83241A Other tear of medial meniscus, current injury, right knee, initial encounter: Secondary | ICD-10-CM

## 2020-10-02 HISTORY — DX: Other tear of medial meniscus, current injury, unspecified knee, initial encounter: S83.249A

## 2020-10-02 MED ORDER — HYDROCODONE-ACETAMINOPHEN 5-325 MG PO TABS: 1.0000 | ORAL_TABLET | Freq: Four times a day (QID) | ORAL | Status: DC | PRN

## 2020-10-02 MED ORDER — HYDROCODONE-ACETAMINOPHEN 5-325 MG PO TABS: 1.0000 | ORAL_TABLET | Freq: Two times a day (BID) | ORAL | 0 refills | Status: DC | PRN

## 2020-10-02 MED ORDER — HYDROCODONE-IBUPROFEN 5-200 MG PO TABS: 1.0000 | ORAL_TABLET | Freq: Three times a day (TID) | ORAL | 0 refills | Status: DC | PRN

## 2020-10-02 NOTE — Progress Notes (Signed)
Subjective:  Patient ID: Marie Cox, female    DOB: 04/24/1981  Age: 39 y.o. MRN: [id number]  Chief Complaint  Patient presents with  . Knee Pain    Right knee pain since one week ago    HPI: pain in right knee for one week.  No twisting.  Pain straightening knee.  Pain on standing.  Mildly swollen.  No locking or giving out.   Current Outpatient Medications on File Prior to Visit  Medication Sig Dispense Refill  . atorvastatin (LIPITOR) 10 MG tablet Take 1 tablet (10 mg total) by mouth daily. 90 tablet 1  . cetirizine (ZYRTEC) 10 MG tablet Take 1 tablet (10 mg total) by mouth daily. 30 tablet 11  . omeprazole (PRILOSEC) 40 MG capsule Take 1 capsule (40 mg total) by mouth daily. 30 capsule 3   No current facility-administered medications on file prior to visit.   Past Medical History:  Diagnosis Date  . Atherosclerosis   . GERD (gastroesophageal reflux disease)   . Migraines    Past Surgical History:  Procedure Laterality Date  . CHOLECYSTECTOMY  2005  . TUBAL LIGATION  2006    Family History  Problem Relation Age of Onset  . Diabetes Mother   . Hypertension Mother   . Heart attack Maternal Grandfather   . Heart Problems Father   . Heart attack Paternal Grandmother   . Lung cancer Paternal Grandfather    Social History   Socioeconomic History  . Marital status: Single    Spouse name: Not on file  . Number of children: 1  . Years of education: Not on file  . Highest education level: Not on file  Occupational History  . Occupation: Forensic psychologist  Tobacco Use  . Smoking status: Former Smoker    Types: Cigarettes    Start date: 04/09/2019  . Smokeless tobacco: Never Used  Substance and Sexual Activity  . Alcohol use: Never  . Drug use: Never  . Sexual activity: Yes    Partners: Male  Other Topics Concern  . Not on file  Social History Narrative  . Not on file   Social Determinants of Health   Financial Resource Strain:   . Difficulty of Paying  Living Expenses: Not on file  Food Insecurity:   . Worried About Programme researcher, broadcasting/film/video in the Last Year: Not on file  . Ran Out of Food in the Last Year: Not on file  Transportation Needs:   . Lack of Transportation (Medical): Not on file  . Lack of Transportation (Non-Medical): Not on file  Physical Activity:   . Days of Exercise per Week: Not on file  . Minutes of Exercise per Session: Not on file  Stress:   . Feeling of Stress : Not on file  Social Connections:   . Frequency of Communication with Friends and Family: Not on file  . Frequency of Social Gatherings with Friends and Family: Not on file  . Attends Religious Services: Not on file  . Active Member of Clubs or Organizations: Not on file  . Attends Banker Meetings: Not on file  . Marital Status: Not on file    Review of Systems  Constitutional: Negative.   HENT: Negative.   Eyes: Negative.   Respiratory: Negative.   Cardiovascular: Negative.   Genitourinary: Negative.   Musculoskeletal: Positive for joint swelling (right knee).  Neurological: Negative.   Psychiatric/Behavioral: Negative.      Objective:  BP 130/70   Pulse 80  Temp 97.8 F (36.6 C)   Resp 16   Ht 5\' 10"  (1.778 m)   Wt 238 lb (108 kg)   SpO2 98%   BMI 34.15 kg/m   BP/Weight 10/02/2020 08/24/2020 07/11/2020  Systolic BP 130 118 108  Diastolic BP 70 78 78  Wt. (Lbs) 238 242.6 248  BMI 34.15 34.81 35.58    Physical Exam Vitals reviewed.  HENT:     Right Ear: Tympanic membrane, ear canal and external ear normal.     Left Ear: Tympanic membrane normal.  Cardiovascular:     Rate and Rhythm: Normal rate and regular rhythm.     Pulses: Normal pulses.     Heart sounds: Normal heart sounds.  Pulmonary:     Effort: Pulmonary effort is normal.     Breath sounds: Normal breath sounds.  Musculoskeletal:        General: Swelling present.     Comments: Pain medial joint line, no pop, positive effusion, full ROm, positive McMurray   But negative lachman  Skin:    Capillary Refill: Capillary refill takes less than 2 seconds.  Neurological:     General: No focal deficit present.     Mental Status: She is alert and oriented to person, place, and time.       Lab Results  Component Value Date   GLUCOSE 104 (H) 06/08/2020   CHOL 146 06/08/2020   TRIG 156 (H) 06/08/2020   HDL 46 06/08/2020   LDLCALC 73 06/08/2020   ALT 18 06/08/2020   AST 17 06/08/2020   NA 139 06/08/2020   K 4.5 06/08/2020   CL 102 06/08/2020   CREATININE 0.95 06/08/2020   BUN 12 06/08/2020   CO2 22 06/08/2020   TSH 2.020 06/08/2020      Assessment & Plan:   1. Tear of medial meniscus of right knee, current, unspecified tear type, initial encounter - DG Knee Complete 4 Views Left - AMB referral to orthopedics - HYDROcodone-acetaminophen (NORCO/VICODIN) 5-325 MG tablet; Take 1 tablet by mouth 2 (two) times daily as needed for severe pain (OSteoarthritis of lumbar spine.).  Dispense: 30 tablet; Refill: 0 AN INDIVIDUAL CARE PLAN meniscal tear was established and reinforced today.  The patient's status was assessed using clinical findings on exam, labs, and other diagnostic testing. Patient's success at meeting treatment goals based on disease specific evidence-bassed guidelines and found to be in fair control. RECOMMENDATIONS include pain medicine and referral to orthopedics, Get X-ray    Meds ordered this encounter  Medications  . DISCONTD: HYDROcodone-acetaminophen (NORCO/VICODIN) 5-325 MG per tablet 1 tablet  . DISCONTD: hydrocodone-ibuprofen (VICOPROFEN) 5-200 MG tablet    Sig: Take 1 tablet by mouth every 8 (eight) hours as needed for pain.    Dispense:  30 tablet    Refill:  0  . HYDROcodone-acetaminophen (NORCO/VICODIN) 5-325 MG tablet    Sig: Take 1 tablet by mouth 2 (two) times daily as needed for severe pain (OSteoarthritis of lumbar spine.).    Dispense:  30 tablet    Refill:  0    Orders Placed This Encounter  Procedures   . DG Knee Complete 4 Views Left  . AMB referral to orthopedics     Follow-up: Return if symptoms worsen or fail to improve.  An After Visit Summary was printed and given to the patient.  06/10/2020 [facility] [phone]

## 2020-10-02 NOTE — Telephone Encounter (Signed)
[facility] called and sts that you sent a rx for pt, hydrocodone 5-200 but they don't carry the 5-200 they have the 7.5-200 and wanted to know if you could resend the rx and send it for the 7.5-200

## 2020-10-06 ENCOUNTER — Telehealth: Payer: Self-pay

## 2020-10-06 NOTE — Telephone Encounter (Signed)
PA was approved for Hydrocodone-Acetaminophen for 10/05/2020 to 01/03/2021.

## 2020-10-12 ENCOUNTER — Ambulatory Visit: Payer: Self-pay | Admitting: Cardiology

## 2020-10-27 ENCOUNTER — Ambulatory Visit (INDEPENDENT_AMBULATORY_CARE_PROVIDER_SITE_OTHER): Payer: 59 | Admitting: Cardiology

## 2020-10-27 ENCOUNTER — Other Ambulatory Visit: Payer: Self-pay

## 2020-10-27 ENCOUNTER — Encounter: Payer: Self-pay | Admitting: Cardiology

## 2020-10-27 VITALS — BP 120/88 | HR 72 | Ht 70.0 in | Wt 246.0 lb

## 2020-10-27 DIAGNOSIS — Z87891 Personal history of nicotine dependence: Secondary | ICD-10-CM | POA: Diagnosis not present

## 2020-10-27 DIAGNOSIS — R0789 Other chest pain: Secondary | ICD-10-CM

## 2020-10-27 DIAGNOSIS — I2584 Coronary atherosclerosis due to calcified coronary lesion: Secondary | ICD-10-CM

## 2020-10-27 DIAGNOSIS — R931 Abnormal findings on diagnostic imaging of heart and coronary circulation: Secondary | ICD-10-CM

## 2020-10-27 DIAGNOSIS — I10 Essential (primary) hypertension: Secondary | ICD-10-CM | POA: Diagnosis not present

## 2020-10-27 DIAGNOSIS — I2510 Atherosclerotic heart disease of native coronary artery without angina pectoris: Secondary | ICD-10-CM

## 2020-10-27 NOTE — Addendum Note (Signed)
Addended by: Hazle Quant on: 10/27/2020 09:16 AM   Modules accepted: Orders

## 2020-10-27 NOTE — Patient Instructions (Addendum)
Medication Instructions:  Your physician recommends that you continue on your current medications as directed. Please refer to the Current Medication list given to you today.  *If you need a refill on your cardiac medications before your next appointment, please call your pharmacy*   Lab Work: Your physician recommends that you return for lab work in December 2021 No appointment needed please come anytime M-F between 8a-12p and 2p-430pm. : LIPID   If you have labs (blood work) drawn today and your tests are completely normal, you will receive your results only by: Marland Kitchen MyChart Message (if you have MyChart) OR . A paper copy in the mail If you have any lab test that is abnormal or we need to change your treatment, we will call you to review the results.   Testing/Procedures: None.   Follow-Up: At [facility], you and your health needs are our priority.  As part of our continuing mission to provide you with exceptional heart care, we have created designated Provider Care Teams.  These Care Teams include your primary Cardiologist (physician) and Advanced Practice Providers (APPs -  Physician Assistants and Nurse Practitioners) who all work together to provide you with the care you need, when you need it.  We recommend signing up for the patient portal called "MyChart".  Sign up information is provided on this After Visit Summary.  MyChart is used to connect with patients for Virtual Visits (Telemedicine).  Patients are able to view lab/test results, encounter notes, upcoming appointments, etc.  Non-urgent messages can be sent to your provider as well.   To learn more about what you can do with MyChart, go to [email or web address].    Your next appointment:   1 year(s)  The format for your next appointment:   In Person  Provider:   Gypsy Balsam, MD   Other Instructions

## 2020-10-27 NOTE — Progress Notes (Signed)
Cardiology Office Note:    Date:  10/27/2020   ID:  Marie Cox, DOB [birth year], MRN [id number]  PCP:  Blane Ohara, MD  Cardiologist:  Gypsy Balsam, MD    Referring MD: Blane Ohara, MD   Chief Complaint  Patient presents with  . Follow-up  Doing well but have a problem my right knee  History of Present Illness:    Marie Cox is a 39 y.o. female with past medical history significant for calcification of the coronary arteries.  In August 2020 she did have a calcium score done which showed small discrete calcium deposit in the mid LAD her coronary calcium score at that time was 7.  She was appropriately put on aspirin as well as cholesterol-lowering medication she been doing well since then.  Last time I seen her her father end up going to the hospital because of some cardiac issue and he required some procedure she wanted to be checked for her heart rate herself.  She was complaining of having some atypical chest pain.  Stress test was done which showed no evidence of ischemia.  She comes today to talk about that.  She is doing very well no chest pain tightness squeezing pressure burning chest.  She does not exercise on the regular basis right now secondary to right knee injury.  She is seeing orthopedics for that and she have another appointment 4 weeks from now.  Recently she got steroid injections to her knee but that did not help a lot.  Past Medical History:  Diagnosis Date  . Atherosclerosis   . GERD (gastroesophageal reflux disease)   . Migraines     Past Surgical History:  Procedure Laterality Date  . CHOLECYSTECTOMY  2005  . TUBAL LIGATION  2006    Current Medications: Current Meds  Medication Sig  . atorvastatin (LIPITOR) 10 MG tablet Take 1 tablet (10 mg total) by mouth daily.  . cetirizine (ZYRTEC) 10 MG tablet Take 1 tablet (10 mg total) by mouth daily.  . meloxicam (MOBIC) 15 MG tablet Take 15 mg by mouth daily.  Marland Kitchen omeprazole (PRILOSEC) 40 MG  capsule Take 1 capsule (40 mg total) by mouth daily.     Allergies:   Clindamycin and Morphine and related   Social History   Socioeconomic History  . Marital status: Single    Spouse name: Not on file  . Number of children: 1  . Years of education: Not on file  . Highest education level: Not on file  Occupational History  . Occupation: Forensic psychologist  Tobacco Use  . Smoking status: Former Smoker    Types: Cigarettes    Start date: 04/09/2019  . Smokeless tobacco: Never Used  Substance and Sexual Activity  . Alcohol use: Never  . Drug use: Never  . Sexual activity: Yes    Partners: Male  Other Topics Concern  . Not on file  Social History Narrative  . Not on file   Social Determinants of Health   Financial Resource Strain:   . Difficulty of Paying Living Expenses: Not on file  Food Insecurity:   . Worried About Programme researcher, broadcasting/film/video in the Last Year: Not on file  . Ran Out of Food in the Last Year: Not on file  Transportation Needs:   . Lack of Transportation (Medical): Not on file  . Lack of Transportation (Non-Medical): Not on file  Physical Activity:   . Days of Exercise per Week: Not on file  . Minutes of Exercise  per Session: Not on file  Stress:   . Feeling of Stress : Not on file  Social Connections:   . Frequency of Communication with Friends and Family: Not on file  . Frequency of Social Gatherings with Friends and Family: Not on file  . Attends Religious Services: Not on file  . Active Member of Clubs or Organizations: Not on file  . Attends Banker Meetings: Not on file  . Marital Status: Not on file     Family History: The patient's family history includes Diabetes in her mother; Heart Problems in her father; Heart attack in her maternal grandfather and paternal grandmother; Hypertension in her mother; Lung cancer in her paternal grandfather. ROS:   Please see the history of present illness.    All 14 point review of systems negative  except as described per history of present illness  EKGs/Labs/Other Studies Reviewed:      Recent Labs: 06/08/2020: ALT 18; BUN 12; Creatinine, Ser 0.95; Potassium 4.5; Sodium 139; TSH 2.020  Recent Lipid Panel    Component Value Date/Time   CHOL 146 06/08/2020 0941   TRIG 156 (H) 06/08/2020 0941   HDL 46 06/08/2020 0941   CHOLHDL 3.2 06/08/2020 0941   LDLCALC 73 06/08/2020 0941    Physical Exam:    VS:  BP 120/88 (BP Location: Left Arm, Patient Position: Sitting, Cuff Size: Large)   Pulse 72   Ht 5\' 10"  (1.778 m)   Wt 246 lb (111.6 kg)   SpO2 98%   BMI 35.30 kg/m     Wt Readings from Last 3 Encounters:  10/27/20 246 lb (111.6 kg)  10/02/20 238 lb (108 kg)  08/24/20 242 lb 9.6 oz (110 kg)     GEN:  Well nourished, well developed in no acute distress HEENT: Normal NECK: No JVD; No carotid bruits LYMPHATICS: No lymphadenopathy CARDIAC: RRR, no murmurs, no rubs, no gallops RESPIRATORY:  Clear to auscultation without rales, wheezing or rhonchi  ABDOMEN: Soft, non-tender, non-distended MUSCULOSKELETAL:  No edema; No deformity  SKIN: Warm and dry LOWER EXTREMITIES: no swelling NEUROLOGIC:  Alert and oriented x 3 PSYCHIATRIC:  Normal affect   ASSESSMENT:    1. Coronary artery calcification   2. Essential hypertension   3. Ex-smoker   4. Atypical chest pain   5. Agatston coronary artery calcium score less than 100    PLAN:    In order of problems listed above:  1. Coronary artery calcification with calcium score of 7 small discrete deposit of calcium in mid LAD.  Case risk factors modifications, therefore, she is on aspirin, I also on small dose of statin which I will continue. 2. Essential hypertension blood pressure well controlled. 3. Ex-smoker continue to abstain. 4. Atypical chest pain denies having any, stress test reviewed negative. 5. Dyslipidemia: Her K PN from summer showed LDL of 73 HDL 46.  We will recheck her fasting lipid profile, will continue  statin.  She is very concerned about her health.  She is worried about her father.  We did talk about Mediterranean diet as well as need to exercise on the regular basis.   Medication Adjustments/Labs and Tests Ordered: Current medicines are reviewed at length with the patient today.  Concerns regarding medicines are outlined above.  No orders of the defined types were placed in this encounter.  Medication changes: No orders of the defined types were placed in this encounter.   Signed, 10/24/20, MD, [facility] 10/27/2020 9:05 AM  [facility]

## 2020-11-23 LAB — LIPID PANEL
Chol/HDL Ratio: 3 ratio (ref 0.0–4.4)
Cholesterol, Total: 151 mg/dL (ref 100–199)
HDL: 51 mg/dL (ref >39)
LDL Chol Calc (NIH): 77 mg/dL (ref 0–99)
Triglycerides: 134 mg/dL (ref 0–149)
VLDL Cholesterol Cal: 23 mg/dL (ref 5–40)

## 2020-11-24 ENCOUNTER — Telehealth: Payer: Self-pay | Admitting: Cardiology

## 2020-11-24 NOTE — Telephone Encounter (Signed)
Patient returning Hayley's call for results, connected call to [facility]

## 2020-12-18 ENCOUNTER — Ambulatory Visit: Payer: Self-pay | Admitting: Cardiology

## 2020-12-20 ENCOUNTER — Other Ambulatory Visit: Payer: Self-pay

## 2020-12-20 ENCOUNTER — Other Ambulatory Visit: Payer: Self-pay | Admitting: Family Medicine

## 2020-12-20 MED ORDER — OMEPRAZOLE 40 MG PO CPDR
40.0000 mg | DELAYED_RELEASE_CAPSULE | Freq: Every day | ORAL | 3 refills | Status: DC
Start: 2020-12-20 — End: 2021-04-19

## 2021-01-30 ENCOUNTER — Encounter: Payer: Self-pay | Admitting: Neurology

## 2021-01-30 ENCOUNTER — Encounter: Payer: 59 | Admitting: Neurology

## 2021-01-30 ENCOUNTER — Ambulatory Visit (INDEPENDENT_AMBULATORY_CARE_PROVIDER_SITE_OTHER): Payer: 59 | Admitting: Neurology

## 2021-01-30 DIAGNOSIS — Z0289 Encounter for other administrative examinations: Secondary | ICD-10-CM

## 2021-01-30 DIAGNOSIS — R200 Anesthesia of skin: Secondary | ICD-10-CM

## 2021-01-30 DIAGNOSIS — G5603 Carpal tunnel syndrome, bilateral upper limbs: Secondary | ICD-10-CM

## 2021-01-30 HISTORY — DX: Anesthesia of skin: R20.0

## 2021-01-30 HISTORY — DX: Carpal tunnel syndrome, bilateral upper limbs: G56.03

## 2021-01-30 NOTE — Progress Notes (Signed)
Full Name: Marie Cox Gender: Female MRN #: [id number] Date of Birth: 08/28/1981    Visit Date: 01/30/2021 09:14 Age: 40 Years Examining Physician: Despina Arias, MD  Referring Physician: Daphene Calamity, PA [facility])  History: Marie Cox is a 40 year old woman with bilateral wrist and hand pain with numbness that goes into the hand, predominantly the first 3 fingers.  Numbness often bothers her at night.  Changing positions will often improve the numbness.  She has not noted any significant weakness in the hands.  Occasionally she will have some pain in the forearms but not higher.  She occasionally has some neck pain.  On exam, she had reduced sensation over the thenar eminences in the palm aspect of the first through third fingers, more noticeable on the right.  Strength was 4+/5 in the right APB muscle and 4++/5 on the left.  Deep tendon reflexes were symmetric.  Nerve conduction studies: Bilateral median and ulnar motor responses had normal distal latencies, amplitudes and conduction velocities.  The orthodromic right median sensory response had a delayed peak latency with normal amplitude.  The left median sensory response was borderline normal.  However, with transcarpal median and ulnar palmar responses comparison, there was slowing in both median sensory responses, right worse than left.  The ulnar sensory responses were normal.  Electromyography: Needle EMG of selected muscles of the left arm/hand and right hand was performed.  Motor unit morphology and recruitment was normal in all of the muscles tested.  There was no abnormal spontaneous activity.  Impression: This NCV/EMG study shows the following: 1.   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   Minimal median neuropathy across the left wrist.  Marie Cox A. Epimenio Foot, MD, PhD, FAAN Certified in Neurology, Clinical Neurophysiology, Sleep Medicine, Pain Medicine and  Neuroimaging Director, [facility]  [facility] [address] [place], [place] [address] [phone]  Clinical note: The patient was advised to try wearing wrist splints and to discuss further with orthopedics or primary care if she gets no benefit  --RAS  Verbal informed consent was obtained from the patient, patient was informed of potential risk of procedure, including bruising, bleeding, hematoma formation, infection, muscle weakness, muscle pain, numbness, among others.        MNC    Nerve / Sites Muscle Latency Ref. Amplitude Ref. Rel Amp Segments Distance Velocity Ref. Area    ms ms mV mV %  cm m/s m/s mVms  R Median - APB     Wrist APB 3.8 ?4.4 5.3 ?4.0 100 Wrist - APB 7   18.5     Upper arm APB 7.9  5.2  98.6 Upper arm - Wrist 24 59 ?49 18.8  L Median - APB     Wrist APB 3.5 ?4.4 8.9 ?4.0 100 Wrist - APB 7   34.2     Upper arm APB 7.2  8.8  98.6 Upper arm - Wrist 21 57 ?49 33.5  R Ulnar - ADM     Wrist ADM 2.3 ?3.3 12.5 ?6.0 100 Wrist - ADM 7   30.4     B.Elbow ADM 5.4  11.9  95.1 B.Elbow - Wrist 20 66 ?49 29.5     A.Elbow ADM 6.9  11.7  98.4 A.Elbow - B.Elbow 10 66 ?49 28.8  L Ulnar - ADM     Wrist ADM 2.1 ?3.3 10.4 ?6.0 100 Wrist -  ADM 7   21.1     B.Elbow ADM 5.5  11.3  108 B.Elbow - Wrist 20 60 ?49 27.0     A.Elbow ADM 7.1  11.7  104 A.Elbow - B.Elbow 10 62 ?49 29.4             SNC    Nerve / Sites Rec. Site Peak Lat Ref.  Amp Ref. Segments Distance Peak Diff Ref.    ms ms V V  cm ms ms  R Median, Ulnar - Transcarpal comparison     Median Palm Wrist 2.7 ?2.2 79 ?35 Median Palm - Wrist 8       Ulnar Palm Wrist 1.8 ?2.2 37 ?12 Ulnar Palm - Wrist 8          Median Palm - Ulnar Palm  0.9 ?0.4  L Median, Ulnar - Transcarpal comparison     Median Palm Wrist 2.3 ?2.2 57 ?35 Median Palm - Wrist 8       Ulnar Palm Wrist 1.7 ?2.2 24 ?12 Ulnar Palm - Wrist 8          Median Palm - Ulnar Palm   0.6 ?0.4  R Median - Orthodromic (Dig II, Mid palm)     Dig II Wrist 3.6 ?3.4 15 ?10 Dig II - Wrist 13    L Median - Orthodromic (Dig II, Mid palm)     Dig II Wrist 3.3 ?3.4 12 ?10 Dig II - Wrist 13    R Ulnar - Orthodromic, (Dig V, Mid palm)     Dig V Wrist 2.5 ?3.1 10 ?5 Dig V - Wrist 11    L Ulnar - Orthodromic, (Dig V, Mid palm)     Dig V Wrist 2.4 ?3.1 8 ?5 Dig V - Wrist 25                   F  Wave    Nerve F Lat Ref.   ms ms  R Ulnar - ADM 25.0 ?32.0  L Ulnar - ADM 25.9 ?32.0         EMG Summary Table    Spontaneous MUAP Recruitment  Muscle IA Fib PSW Fasc Other Amp Dur. Poly Pattern  L. Deltoid Normal None None None _______ Normal Normal Normal Normal  L. Biceps brachii Normal None None None _______ Normal Normal Normal Normal  L. Triceps brachii Normal None None None _______ Normal Normal Normal Normal  L. Abductor pollicis brevis Normal None None None _______ Normal Normal Normal Normal  L. First dorsal interosseous Normal None None None _______ Normal Normal Normal Normal  R. First dorsal interosseous Normal None None None _______ Normal Normal Normal Normal  R. Abductor pollicis brevis Normal None None None _______ Normal Normal Normal Normal

## 2021-04-09 ENCOUNTER — Other Ambulatory Visit: Payer: Self-pay

## 2021-04-09 ENCOUNTER — Encounter: Payer: Self-pay | Admitting: Legal Medicine

## 2021-04-09 ENCOUNTER — Ambulatory Visit (INDEPENDENT_AMBULATORY_CARE_PROVIDER_SITE_OTHER): Payer: 59 | Admitting: Legal Medicine

## 2021-04-09 VITALS — BP 124/90 | HR 67 | Temp 97.4°F | Resp 16 | Ht 70.0 in | Wt 263.0 lb

## 2021-04-09 DIAGNOSIS — I10 Essential (primary) hypertension: Secondary | ICD-10-CM | POA: Diagnosis not present

## 2021-04-09 DIAGNOSIS — S161XXA Strain of muscle, fascia and tendon at neck level, initial encounter: Secondary | ICD-10-CM | POA: Diagnosis not present

## 2021-04-09 MED ORDER — KETOROLAC TROMETHAMINE 60 MG/2ML IM SOLN
60.0000 mg | Freq: Once | INTRAMUSCULAR | Status: AC
  Administered 2021-04-09: 60 mg via INTRAMUSCULAR

## 2021-04-09 MED ORDER — HYDROCODONE-ACETAMINOPHEN 10-325 MG PO TABS: 1.0000 | ORAL_TABLET | Freq: Three times a day (TID) | ORAL | 0 refills | Status: AC | PRN

## 2021-04-09 MED ORDER — PREDNISONE 10 MG (21) PO TBPK: ORAL_TABLET | ORAL | 0 refills | Status: DC

## 2021-04-09 NOTE — Patient Instructions (Signed)

## 2021-04-09 NOTE — Progress Notes (Signed)
Subjective:  Patient ID: Marie Cox, female    DOB: [birth year]  Age: 40 y.o. MRN: [id number]  Chief Complaint  Patient presents with  . Shoulder Pain    Right shoulder dull pain since 3 days ago. Pain radiates to right side of the neck and back pain.    HPI: pain in neck for 3 days.  Started in middle of day.  pain on right neck.  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 Pain on any neck movement.   Current Outpatient Medications on File Prior to Visit  Medication Sig Dispense Refill  . cetirizine (ZYRTEC) 10 MG tablet Take 1 tablet (10 mg total) by mouth daily. 30 tablet 11  . diclofenac Sodium (VOLTAREN) 1 % GEL Apply topically.    . meloxicam (MOBIC) 15 MG tablet Take 15 mg by mouth daily.    Marland Kitchen omeprazole (PRILOSEC) 40 MG capsule Take 1 capsule (40 mg total) by mouth daily. 30 capsule 3  . atorvastatin (LIPITOR) 10 MG tablet Take 1 tablet (10 mg total) by mouth daily. 90 tablet 1   No current facility-administered medications on file prior to visit.   Past Medical History:  Diagnosis Date  . Atherosclerosis   . GERD (gastroesophageal reflux disease)   . Migraines    Past Surgical History:  Procedure Laterality Date  . CHOLECYSTECTOMY  2005  . TUBAL LIGATION  2006    Family History  Problem Relation Age of Onset  . Diabetes Mother   . Hypertension Mother   . Heart attack Maternal Grandfather   . Heart Problems Father   . Heart attack Paternal Grandmother   . Lung cancer Paternal Grandfather    Social History   Socioeconomic History  . Marital status: Single    Spouse name: Not on file  . Number of children: 1  . Years of education: Not on file  . Highest education level: Not on file  Occupational History  . Occupation: Forensic psychologist  Tobacco Use  . Smoking status: Former Smoker    Types: Cigarettes    Start date: 04/09/2019  . Smokeless tobacco: Never Used  Vaping Use  . Vaping Use: Never used  Substance and Sexual Activity  .  Alcohol use: Never  . Drug use: Never  . Sexual activity: Yes    Partners: Male  Other Topics Concern  . Not on file  Social History Narrative  . Not on file   Social Determinants of Health   Financial Resource Strain: Not on file  Food Insecurity: Not on file  Transportation Needs: Not on file  Physical Activity: Not on file  Stress: Not on file  Social Connections: Not on file    Review of Systems  Constitutional: Negative for activity change and appetite change.  HENT: Negative for congestion and sinus pain.   Eyes: Negative for visual disturbance.  Respiratory: Negative for chest tightness and shortness of breath.   Cardiovascular: Negative for chest pain, palpitations and leg swelling.  Gastrointestinal: Negative for abdominal distention and abdominal pain.  Endocrine: Negative for polyuria.  Genitourinary: Negative for dyspareunia.  Musculoskeletal: Positive for neck pain.  Neurological: Negative.   Psychiatric/Behavioral: Negative.      Objective:  BP 124/90   Pulse 67   Temp (!) 97.4 F (36.3 C)   Resp 16   Ht 5\' 10"  (1.778 m)   Wt 263 lb (119.3 kg)   SpO2 97%   BMI 37.74 kg/m   BP/Weight 04/09/2021 10/27/2020 10/02/2020  Systolic  BP 124 120 130  Diastolic BP 90 88 70  Wt. (Lbs) 263 246 238  BMI 37.74 35.3 34.15    Physical Exam Constitutional:      Appearance: Normal appearance. She is obese.  HENT:     Right Ear: Tympanic membrane normal.     Left Ear: Tympanic membrane normal.     Mouth/Throat:     Mouth: Mucous membranes are moist.     Pharynx: Oropharynx is clear.  Eyes:     Extraocular Movements: Extraocular movements intact.     Conjunctiva/sclera: Conjunctivae normal.     Pupils: Pupils are equal, round, and reactive to light.  Cardiovascular:     Rate and Rhythm: Normal rate and regular rhythm.     Pulses: Normal pulses.     Heart sounds: No murmur heard. No gallop.   Pulmonary:     Effort: Pulmonary effort is normal. No  respiratory distress.     Breath sounds: Normal breath sounds. No rales.  Abdominal:     General: Abdomen is flat. Bowel sounds are normal. There is no distension.     Palpations: Abdomen is soft.     Tenderness: There is no abdominal tenderness.  Musculoskeletal:        General: Tenderness present.       Arms:  Skin:    General: Skin is warm.     Capillary Refill: Capillary refill takes less than 2 seconds.  Neurological:     General: No focal deficit present.     Mental Status: She is alert. Mental status is at baseline.       Lab Results  Component Value Date   GLUCOSE 104 (H) 06/08/2020   CHOL 151 11/23/2020   TRIG 134 11/23/2020   HDL 51 11/23/2020   LDLCALC 77 11/23/2020   ALT 18 06/08/2020   AST 17 06/08/2020   NA 139 06/08/2020   K 4.5 06/08/2020   CL 102 06/08/2020   CREATININE 0.95 06/08/2020   BUN 12 06/08/2020   CO2 22 06/08/2020   TSH 2.020 06/08/2020      Assessment & Plan:   Diagnoses and all orders for this visit: Essential hypertension An individual hypertension care plan was established and reinforced today.  The patient's status was assessed using clinical findings on exam and labs or diagnostic tests. The patient's success at meeting treatment goals on disease specific evidence-based guidelines and found to be well controlled. SELF MANAGEMENT: The patient and I together assessed ways to personally work towards obtaining the recommended goals. RECOMMENDATIONS: avoid decongestants found in common cold remedies, decrease consumption of alcohol, perform routine monitoring of BP with home BP cuff, exercise, reduction of dietary salt, take medicines as prescribed, try not to miss doses and quit smoking.  Regular exercise and maintaining a healthy weight is needed.  Stress reduction may help. A CLINICAL SUMMARY including written plan identify barriers to care unique to individual due to social or financial issues.  We attempt to mutually creat solutions  for individual and family understanding. -     MM Digital Screening Strain of neck muscle, initial encounter -     HYDROcodone-acetaminophen (NORCO) 10-325 MG tablet; Take 1 tablet by mouth every 8 (eight) hours as needed for up to 5 days. -     predniSONE (STERAPRED UNI-PAK 21 TAB) 10 MG (21) TBPK tablet; Take 6ills first day , then 5 pills day 2 and then cut down one pill day until gone -     ketorolac (  TORADOL) injection 60 mg Patient has muscle strain with spasms.  Use medicines ROM neck, tens unit and heat.        Follow-up: Return if symptoms worsen or fail to improve.  An After Visit Summary was printed and given to the patient.  Brent Bulla, MD [facility] [phone]

## 2021-04-12 ENCOUNTER — Other Ambulatory Visit: Payer: Self-pay | Admitting: Family Medicine

## 2021-04-17 ENCOUNTER — Other Ambulatory Visit: Payer: Self-pay | Admitting: Family Medicine

## 2021-04-19 ENCOUNTER — Encounter: Payer: Self-pay | Admitting: Legal Medicine

## 2021-04-19 ENCOUNTER — Other Ambulatory Visit: Payer: Self-pay

## 2021-04-19 ENCOUNTER — Ambulatory Visit (INDEPENDENT_AMBULATORY_CARE_PROVIDER_SITE_OTHER): Payer: 59 | Admitting: Legal Medicine

## 2021-04-19 VITALS — BP 128/100 | HR 74 | Resp 16 | Ht 70.0 in | Wt 265.0 lb

## 2021-04-19 DIAGNOSIS — I10 Essential (primary) hypertension: Secondary | ICD-10-CM

## 2021-04-19 DIAGNOSIS — R0789 Other chest pain: Secondary | ICD-10-CM

## 2021-04-19 DIAGNOSIS — M7552 Bursitis of left shoulder: Secondary | ICD-10-CM

## 2021-04-19 HISTORY — DX: Bursitis of left shoulder: M75.52

## 2021-04-19 MED ORDER — LISINOPRIL 10 MG PO TABS: 10.0000 mg | ORAL_TABLET | Freq: Every day | ORAL | 3 refills | Status: DC

## 2021-04-19 MED ORDER — ATORVASTATIN CALCIUM 10 MG PO TABS: 10.0000 mg | ORAL_TABLET | Freq: Every day | ORAL | 1 refills | Status: DC

## 2021-04-19 NOTE — Patient Instructions (Signed)
Shoulder Exercises Ask your health care provider which exercises are safe for you. Do exercises exactly as told by your health care provider and adjust them as directed. It is normal to feel mild stretching, pulling, tightness, or discomfort as you do these exercises. Stop right away if you feel sudden pain or your pain gets worse. Do not begin these exercises until told by your health care provider. Stretching exercises External rotation and abduction This exercise is sometimes called corner stretch. This exercise rotates your arm outward (external rotation) and moves your arm out from your body (abduction). 1. Stand in a doorway with one of your feet slightly in front of the other. This is called a staggered stance. If you cannot reach your forearms to the door frame, stand facing a corner of a room. 2. Choose one of the following positions as told by your health care provider: ? Place your hands and forearms on the door frame above your head. ? Place your hands and forearms on the door frame at the height of your head. ? Place your hands on the door frame at the height of your elbows. 3. Slowly move your weight onto your front foot until you feel a stretch across your chest and in the front of your shoulders. Keep your head and chest upright and keep your abdominal muscles tight. 4. Hold for __________ seconds. 5. To release the stretch, shift your weight to your back foot. Repeat __________ times. Complete this exercise __________ times a day.   Extension, standing 1. Stand and hold a broomstick, a cane, or a similar object behind your back. ? Your hands should be a little wider than shoulder width apart. ? Your palms should face away from your back. 2. Keeping your elbows straight and your shoulder muscles relaxed, move the stick away from your body until you feel a stretch in your shoulders (extension). ? Avoid shrugging your shoulders while you move the stick. Keep your shoulder blades  tucked down toward the middle of your back. 3. Hold for __________ seconds. 4. Slowly return to the starting position. Repeat __________ times. Complete this exercise __________ times a day. Range-of-motion exercises Pendulum 1. Stand near a wall or a surface that you can hold onto for balance. 2. Bend at the waist and let your left / right arm hang straight down. Use your other arm to support you. Keep your back straight and do not lock your knees. 3. Relax your left / right arm and shoulder muscles, and move your hips and your trunk so your left / right arm swings freely. Your arm should swing because of the motion of your body, not because you are using your arm or shoulder muscles. 4. Keep moving your hips and trunk so your arm swings in the following directions, as told by your health care provider: ? Side to side. ? Forward and backward. ? In clockwise and counterclockwise circles. 5. Continue each motion for __________ seconds, or for as long as told by your health care provider. 6. Slowly return to the starting position. Repeat __________ times. Complete this exercise __________ times a day.   Shoulder flexion, standing 1. Stand and hold a broomstick, a cane, or a similar object. Place your hands a little more than shoulder width apart on the object. Your left / right hand should be palm up, and your other hand should be palm down. 2. Keep your elbow straight and your shoulder muscles relaxed. Push the stick up with your healthy   arm to raise your left / right arm in front of your body, and then over your head until you feel a stretch in your shoulder (flexion). ? Avoid shrugging your shoulder while you raise your arm. Keep your shoulder blade tucked down toward the middle of your back. 3. Hold for __________ seconds. 4. Slowly return to the starting position. Repeat __________ times. Complete this exercise __________ times a day.   Shoulder abduction, standing 1. Stand and hold a  broomstick, a cane, or a similar object. Place your hands a little more than shoulder width apart on the object. Your left / right hand should be palm up, and your other hand should be palm down. 2. Keep your elbow straight and your shoulder muscles relaxed. Push the object across your body toward your left / right side. Raise your left / right arm to the side of your body (abduction) until you feel a stretch in your shoulder. ? Do not raise your arm above shoulder height unless your health care provider tells you to do that. ? If directed, raise your arm over your head. ? Avoid shrugging your shoulder while you raise your arm. Keep your shoulder blade tucked down toward the middle of your back. 3. Hold for __________ seconds. 4. Slowly return to the starting position. Repeat __________ times. Complete this exercise __________ times a day. Internal rotation 1. Place your left / right hand behind your back, palm up. 2. Use your other hand to dangle an exercise band, a towel, or a similar object over your shoulder. Grasp the band with your left / right hand so you are holding on to both ends. 3. Gently pull up on the band until you feel a stretch in the front of your left / right shoulder. The movement of your arm toward the center of your body is called internal rotation. ? Avoid shrugging your shoulder while you raise your arm. Keep your shoulder blade tucked down toward the middle of your back. 4. Hold for __________ seconds. 5. Release the stretch by letting go of the band and lowering your hands. Repeat __________ times. Complete this exercise __________ times a day.   Strengthening exercises External rotation 1. Sit in a stable chair without armrests. 2. Secure an exercise band to a stable object at elbow height on your left / right side. 3. Place a soft object, such as a folded towel or a small pillow, between your left / right upper arm and your body to move your elbow about 4 inches (10  cm) away from your side. 4. Hold the end of the exercise band so it is tight and there is no slack. 5. Keeping your elbow pressed against the soft object, slowly move your forearm out, away from your abdomen (external rotation). Keep your body steady so only your forearm moves. 6. Hold for __________ seconds. 7. Slowly return to the starting position. Repeat __________ times. Complete this exercise __________ times a day.   Shoulder abduction 1. Sit in a stable chair without armrests, or stand up. 2. Hold a __________ weight in your left / right hand, or hold an exercise band with both hands. 3. Start with your arms straight down and your left / right palm facing in, toward your body. 4. Slowly lift your left / right hand out to your side (abduction). Do not lift your hand above shoulder height unless your health care provider tells you that this is safe. ? Keep your arms straight. ? Avoid   shrugging your shoulder while you do this movement. Keep your shoulder blade tucked down toward the middle of your back. 5. Hold for __________ seconds. 6. Slowly lower your arm, and return to the starting position. Repeat __________ times. Complete this exercise __________ times a day.   Shoulder extension 1. Sit in a stable chair without armrests, or stand up. 2. Secure an exercise band to a stable object in front of you so it is at shoulder height. 3. Hold one end of the exercise band in each hand. Your palms should face each other. 4. Straighten your elbows and lift your hands up to shoulder height. 5. Step back, away from the secured end of the exercise band, until the band is tight and there is no slack. 6. Squeeze your shoulder blades together as you pull your hands down to the sides of your thighs (extension). Stop when your hands are straight down by your sides. Do not let your hands go behind your body. 7. Hold for __________ seconds. 8. Slowly return to the starting position. Repeat __________  times. Complete this exercise __________ times a day. Shoulder row 1. Sit in a stable chair without armrests, or stand up. 2. Secure an exercise band to a stable object in front of you so it is at waist height. 3. Hold one end of the exercise band in each hand. Position your palms so that your thumbs are facing the ceiling (neutral position). 4. Bend each of your elbows to a 90-degree angle (right angle) and keep your upper arms at your sides. 5. Step back until the band is tight and there is no slack. 6. Slowly pull your elbows back behind you. 7. Hold for __________ seconds. 8. Slowly return to the starting position. Repeat __________ times. Complete this exercise __________ times a day. Shoulder press-ups 1. Sit in a stable chair that has armrests. Sit upright, with your feet flat on the floor. 2. Put your hands on the armrests so your elbows are bent and your fingers are pointing forward. Your hands should be about even with the sides of your body. 3. Push down on the armrests and use your arms to lift yourself off the chair. Straighten your elbows and lift yourself up as much as you comfortably can. ? Move your shoulder blades down, and avoid letting your shoulders move up toward your ears. ? Keep your feet on the ground. As you get stronger, your feet should support less of your body weight as you lift yourself up. 4. Hold for __________ seconds. 5. Slowly lower yourself back into the chair. Repeat __________ times. Complete this exercise __________ times a day.   Wall push-ups 1. Stand so you are facing a stable wall. Your feet should be about one arm-length away from the wall. 2. Lean forward and place your palms on the wall at shoulder height. 3. Keep your feet flat on the floor as you bend your elbows and lean forward toward the wall. 4. Hold for __________ seconds. 5. Straighten your elbows to push yourself back to the starting position. Repeat __________ times. Complete this  exercise __________ times a day.   This information is not intended to replace advice given to you by your health care provider. Make sure you discuss any questions you have with your health care provider. Document Revised: 04/02/2019 Document Reviewed: 01/08/2019 Elsevier Patient Education  2021 [employer]  

## 2021-04-19 NOTE — Telephone Encounter (Signed)
Yours

## 2021-04-19 NOTE — Telephone Encounter (Signed)
Your pt

## 2021-04-19 NOTE — Progress Notes (Signed)
Subjective:  Patient ID: Marie Cox, female    DOB: [birth year]  Age: 40 y.o. MRN: [id number]  Chief Complaint  Patient presents with  . Follow-up  . Shoulder Pain    Left shoulder pain with radiation to the chest  . Hypertension    HPIFollow up from emergency room for chest pain negative enzymes, EKG, and CT chest . She was put on robaxin- helped some.  BP remains elevated. 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  She has pain on lifting left shoulder.   Current Outpatient Medications on File Prior to Visit  Medication Sig Dispense Refill  . cetirizine (ZYRTEC) 10 MG tablet Take 1 tablet (10 mg total) by mouth daily. 30 tablet 11  . diclofenac Sodium (VOLTAREN) 1 % GEL Apply topically.    . meloxicam (MOBIC) 15 MG tablet Take 15 mg by mouth daily.    . methocarbamol (ROBAXIN) 750 MG tablet     . predniSONE (DELTASONE) 10 MG tablet Take by mouth.    Marland Kitchen atorvastatin (LIPITOR) 10 MG tablet Take 1 tablet (10 mg total) by mouth daily. 90 tablet 1  . omeprazole (PRILOSEC) 40 MG capsule TAKE 1 CAPSULE BY MOUTH ONCE DAILY. 30 capsule 3   No current facility-administered medications on file prior to visit.   Past Medical History:  Diagnosis Date  . Atherosclerosis   . GERD (gastroesophageal reflux disease)   . Migraines    Past Surgical History:  Procedure Laterality Date  . CHOLECYSTECTOMY  2005  . TUBAL LIGATION  2006    Family History  Problem Relation Age of Onset  . Diabetes Mother   . Hypertension Mother   . Heart attack Maternal Grandfather   . Heart Problems Father   . Heart attack Paternal Grandmother   . Lung cancer Paternal Grandfather    Social History   Socioeconomic History  . Marital status: Single    Spouse name: Not on file  . Number of children: 1  . Years of education: Not on file  . Highest education level: Not on file  Occupational History  . Occupation: Forensic psychologist  Tobacco Use  . Smoking  status: Former Smoker    Types: Cigarettes    Start date: 04/09/2019  . Smokeless tobacco: Never Used  Vaping Use  . Vaping Use: Never used  Substance and Sexual Activity  . Alcohol use: Never  . Drug use: Never  . Sexual activity: Yes    Partners: Male  Other Topics Concern  . Not on file  Social History Narrative  . Not on file   Social Determinants of Health   Financial Resource Strain: Not on file  Food Insecurity: Not on file  Transportation Needs: Not on file  Physical Activity: Not on file  Stress: Not on file  Social Connections: Not on file    Review of Systems  Constitutional: Negative for activity change and appetite change.  HENT: Negative for congestion.   Eyes: Negative for visual disturbance.  Respiratory: Negative for chest tightness and shortness of breath.   Cardiovascular: Positive for chest pain. Negative for palpitations and leg swelling.  Gastrointestinal: Negative for abdominal distention and abdominal pain.  Genitourinary: Negative for difficulty urinating and dysuria.  Musculoskeletal: Negative.  Negative for arthralgias and back pain.  Skin: Negative.   Neurological: Negative.      Objective:  BP (!) 128/100   Pulse 74   Resp 16   Ht 5\' 10"  (  1.778 m)   Wt 265 lb (120.2 kg)   SpO2 97%   BMI 38.02 kg/m   BP/Weight 04/19/2021 04/09/2021 10/27/2020  Systolic BP 128 124 120  Diastolic BP 100 90 88  Wt. (Lbs) 265 263 246  BMI 38.02 37.74 35.3    Physical Exam Vitals reviewed.  Constitutional:      Appearance: Normal appearance.  HENT:     Head: Normocephalic.     Right Ear: Tympanic membrane normal.     Left Ear: Tympanic membrane normal.     Nose: Nose normal.  Cardiovascular:     Rate and Rhythm: Normal rate and regular rhythm.     Pulses: Normal pulses.     Heart sounds: Normal heart sounds. No murmur heard. No gallop.   Pulmonary:     Effort: Pulmonary effort is normal. No respiratory distress.     Breath sounds: Normal  breath sounds. No rales.     Comments: No chest wall pain Abdominal:     General: Abdomen is flat. Bowel sounds are normal. There is no distension.     Palpations: Abdomen is soft.  Musculoskeletal:       Arms:     Cervical back: Normal range of motion and neck supple.     Comments: Left shoulder pain, pain on abduction 90 degrees, negative impingement, pain on rotation greater than 90 degrees.  Skin:    General: Skin is warm and dry.  Neurological:     General: No focal deficit present.     Mental Status: She is alert. Mental status is at baseline.       Lab Results  Component Value Date   GLUCOSE 104 (H) 06/08/2020   CHOL 151 11/23/2020   TRIG 134 11/23/2020   HDL 51 11/23/2020   LDLCALC 77 11/23/2020   ALT 18 06/08/2020   AST 17 06/08/2020   NA 139 06/08/2020   K 4.5 06/08/2020   CL 102 06/08/2020   CREATININE 0.95 06/08/2020   BUN 12 06/08/2020   CO2 22 06/08/2020   TSH 2.020 06/08/2020      Assessment & Plan:    Diagnoses and all orders for this visit: Atypical chest pain Patient was in ER with chest pain and cardiac and pulmonary cause ruled out.  Essential hypertension -     lisinopril (ZESTRIL) 10 MG tablet; Take 1 tablet (10 mg total) by mouth daily. Start patient on lisinopril and cut salt in tacke  Bursitis of shoulder, left Patient having bursitis left shoulder, injected  After consent was obtained, using sterile technique the left shoulder was prepped and plain Lidocaine 1% was used as local anesthetic. The joint was entered and 0 ml's of colored fluid was withdrawn   Steroid 80 mg and 3 ml plain Lidocaine was then injected and the needle withdrawn.  The procedure was well tolerated.  The patient is asked to continue to rest the joint for a few more days before resuming regular activities.  It may be more painful for the first 1-2 days.  Watch for fever, or increased swelling or persistent pain in the joint. Call or return to clinic prn if such  symptoms occur or there is failure to improve as anticipated.        Follow-up: Return in about 1 month (around 05/19/2021) for fu shoulder.  An After Visit Summary was printed and given to the patient.  Brent Bulla, MD [facility] [phone]

## 2021-05-14 ENCOUNTER — Ambulatory Visit
Admission: RE | Admit: 2021-05-14 | Discharge: 2021-05-14 | Disposition: A | Discharge status: Home / Self Care | Payer: 59 | Source: Ambulatory Visit | Attending: Legal Medicine | Admitting: Legal Medicine

## 2021-05-14 ENCOUNTER — Other Ambulatory Visit: Payer: Self-pay

## 2021-05-17 NOTE — Progress Notes (Signed)
Patient needs ddiagnostic mammogram and possible ultrasound for fibroglandular areas lp

## 2021-05-18 ENCOUNTER — Ambulatory Visit: Payer: 59 | Admitting: Legal Medicine

## 2021-05-18 ENCOUNTER — Other Ambulatory Visit: Payer: Self-pay | Admitting: Legal Medicine

## 2021-05-18 ENCOUNTER — Other Ambulatory Visit: Payer: Self-pay

## 2021-05-18 DIAGNOSIS — R928 Other abnormal and inconclusive findings on diagnostic imaging of breast: Secondary | ICD-10-CM

## 2021-05-22 ENCOUNTER — Other Ambulatory Visit: Payer: Self-pay

## 2021-05-22 ENCOUNTER — Ambulatory Visit (INDEPENDENT_AMBULATORY_CARE_PROVIDER_SITE_OTHER): Payer: 59 | Admitting: Legal Medicine

## 2021-05-22 ENCOUNTER — Encounter: Payer: Self-pay | Admitting: Legal Medicine

## 2021-05-22 VITALS — BP 100/70 | HR 75 | Temp 97.3°F | Resp 16 | Ht 70.0 in | Wt 255.0 lb

## 2021-05-22 DIAGNOSIS — L03113 Cellulitis of right upper limb: Secondary | ICD-10-CM | POA: Diagnosis not present

## 2021-05-22 DIAGNOSIS — W5501XA Bitten by cat, initial encounter: Secondary | ICD-10-CM

## 2021-05-22 DIAGNOSIS — L0390 Cellulitis, unspecified: Secondary | ICD-10-CM

## 2021-05-22 DIAGNOSIS — M7552 Bursitis of left shoulder: Secondary | ICD-10-CM

## 2021-05-22 DIAGNOSIS — Z23 Encounter for immunization: Secondary | ICD-10-CM

## 2021-05-22 HISTORY — DX: Cellulitis, unspecified: L03.90

## 2021-05-22 MED ORDER — AMOXICILLIN-POT CLAVULANATE 875-125 MG PO TABS: 1.0000 | ORAL_TABLET | Freq: Two times a day (BID) | ORAL | 0 refills | Status: DC

## 2021-05-22 MED ORDER — TETANUS-DIPHTHERIA TOXOIDS TD 5-2 LFU IM INJ: 0.5000 mL | INJECTION | Freq: Once | INTRAMUSCULAR | Status: DC

## 2021-05-22 NOTE — Progress Notes (Signed)
Subjective:  Patient ID: Marie Cox, female    DOB: [birth year]  Age: 40 y.o. MRN: [id number]  Chief Complaint  Patient presents with  . Chest Pain  . Animal Bite    HPI: follow up for atypical chest pain, negative cardiac workup.  Last visit I injected shoulder. She now has full rOM without problems  She had cat bite on right forearm 2 days ago, it is red and tender.   Current Outpatient Medications on File Prior to Visit  Medication Sig Dispense Refill  . atorvastatin (LIPITOR) 10 MG tablet Take 1 tablet (10 mg total) by mouth daily. 90 tablet 1  . cetirizine (ZYRTEC) 10 MG tablet Take 1 tablet (10 mg total) by mouth daily. 30 tablet 11  . diclofenac Sodium (VOLTAREN) 1 % GEL Apply topically.    Marland Kitchen lisinopril (ZESTRIL) 10 MG tablet Take 1 tablet (10 mg total) by mouth daily. 90 tablet 3  . meloxicam (MOBIC) 15 MG tablet Take 15 mg by mouth daily.    . methocarbamol (ROBAXIN) 750 MG tablet     . omeprazole (PRILOSEC) 40 MG capsule TAKE 1 CAPSULE BY MOUTH ONCE DAILY. 30 capsule 3   No current facility-administered medications on file prior to visit.   Past Medical History:  Diagnosis Date  . Atherosclerosis   . GERD (gastroesophageal reflux disease)   . Migraines    Past Surgical History:  Procedure Laterality Date  . CHOLECYSTECTOMY  2005  . TUBAL LIGATION  2006    Family History  Problem Relation Age of Onset  . Diabetes Mother   . Hypertension Mother   . Heart attack Maternal Grandfather   . Heart Problems Father   . Heart attack Paternal Grandmother   . Lung cancer Paternal Grandfather    Social History   Socioeconomic History  . Marital status: Single    Spouse name: Not on file  . Number of children: 1  . Years of education: Not on file  . Highest education level: Not on file  Occupational History  . Occupation: Forensic psychologist  Tobacco Use  . Smoking status: Former Smoker    Types: Cigarettes    Start date: 04/09/2019  . Smokeless tobacco:  Never Used  Vaping Use  . Vaping Use: Never used  Substance and Sexual Activity  . Alcohol use: Never  . Drug use: Never  . Sexual activity: Not Currently    Partners: Male  Other Topics Concern  . Not on file  Social History Narrative  . Not on file   Social Determinants of Health   Financial Resource Strain: Not on file  Food Insecurity: Not on file  Transportation Needs: Not on file  Physical Activity: Not on file  Stress: Not on file  Social Connections: Not on file    Review of Systems  Constitutional: Negative for activity change and appetite change.  HENT: Positive for congestion and rhinorrhea.   Eyes: Negative for visual disturbance.  Respiratory: Negative for chest tightness and shortness of breath.   Cardiovascular: Negative for chest pain and leg swelling.  Gastrointestinal: Negative for abdominal distention and abdominal pain.  Genitourinary: Negative for difficulty urinating and dysuria.  Musculoskeletal: Negative for arthralgias and back pain.  Neurological: Negative.   Psychiatric/Behavioral: Negative.      Objective:  BP 100/70   Pulse 75   Temp (!) 97.3 F (36.3 C)   Resp 16   Ht 5\' 10"  (1.778 m)   Wt 255 lb (115.7 kg)   LMP  05/07/2021 (Approximate)   SpO2 99%   BMI 36.59 kg/m   BP/Weight 05/22/2021 04/19/2021 04/09/2021  Systolic BP 100 128 124  Diastolic BP 70 100 90  Wt. (Lbs) 255 265 263  BMI 36.59 38.02 37.74    Physical Exam Vitals reviewed.  Constitutional:      Appearance: Normal appearance.  Eyes:     Extraocular Movements: Extraocular movements intact.     Conjunctiva/sclera: Conjunctivae normal.     Pupils: Pupils are equal, round, and reactive to light.  Cardiovascular:     Rate and Rhythm: Normal rate and regular rhythm.     Pulses: Normal pulses.     Heart sounds: Normal heart sounds. No murmur heard. No gallop.   Pulmonary:     Effort: Pulmonary effort is normal. No respiratory distress.     Breath sounds: Normal  breath sounds. No wheezing.  Musculoskeletal:        General: Normal range of motion.     Left shoulder: No swelling, deformity, effusion, laceration, tenderness, bony tenderness or crepitus. Normal range of motion. Normal strength. Normal pulse.  Skin:    General: Skin is warm.     Capillary Refill: Capillary refill takes less than 2 seconds.     Comments: Cat bite right forearm with redness and pain  Neurological:     General: No focal deficit present.     Mental Status: She is alert and oriented to person, place, and time. Mental status is at baseline.       Lab Results  Component Value Date   GLUCOSE 104 (H) 06/08/2020   CHOL 151 11/23/2020   TRIG 134 11/23/2020   HDL 51 11/23/2020   LDLCALC 77 11/23/2020   ALT 18 06/08/2020   AST 17 06/08/2020   NA 139 06/08/2020   K 4.5 06/08/2020   CL 102 06/08/2020   CREATININE 0.95 06/08/2020   BUN 12 06/08/2020   CO2 22 06/08/2020   TSH 2.020 06/08/2020      Assessment & Plan:   Diagnoses and all orders for this visit: Cellulitis of right upper extremity -     amoxicillin-clavulanate (AUGMENTIN) 875-125 MG tablet; Take 1 tablet by mouth 2 (two) times daily. Patient has cat bite right forearm with surrounding erythema  Bursitis of shoulder, left Bursitis has done well, full ROM  Cat bite, initial encounter -     Tdap vaccine greater than or equal to 7yo IM Patient was bit by cat 05/21/21  Need for vaccination -     Tdap vaccine greater than or equal to 7yo IM        Follow-up: Return if symptoms worsen or fail to improve.  An After Visit Summary was printed and given to the patient.  Brent Bulla, MD [facility] [phone]

## 2021-06-11 ENCOUNTER — Other Ambulatory Visit: Payer: Self-pay

## 2021-06-11 ENCOUNTER — Other Ambulatory Visit: Payer: Self-pay | Admitting: Legal Medicine

## 2021-06-11 ENCOUNTER — Ambulatory Visit
Admission: RE | Admit: 2021-06-11 | Discharge: 2021-06-11 | Disposition: A | Discharge status: Home / Self Care | Payer: 59 | Source: Ambulatory Visit | Attending: Legal Medicine | Admitting: Legal Medicine

## 2021-06-11 DIAGNOSIS — R928 Other abnormal and inconclusive findings on diagnostic imaging of breast: Secondary | ICD-10-CM

## 2021-06-11 NOTE — Progress Notes (Signed)
Cyst present left breast lp

## 2021-06-11 NOTE — Progress Notes (Signed)
Complicated cyst left breat- they recommend mammogram in 6 months

## 2021-07-02 HISTORY — PX: CARPAL TUNNEL RELEASE: SHX101

## 2021-08-21 ENCOUNTER — Ambulatory Visit: Payer: 59 | Admitting: Legal Medicine

## 2021-08-21 ENCOUNTER — Telehealth: Payer: Self-pay | Admitting: Legal Medicine

## 2021-08-21 ENCOUNTER — Other Ambulatory Visit: Payer: Self-pay | Admitting: Legal Medicine

## 2021-08-21 ENCOUNTER — Other Ambulatory Visit: Payer: Self-pay | Admitting: Family Medicine

## 2021-08-21 ENCOUNTER — Other Ambulatory Visit: Payer: Self-pay

## 2021-08-21 ENCOUNTER — Ambulatory Visit (INDEPENDENT_AMBULATORY_CARE_PROVIDER_SITE_OTHER): Payer: 59 | Admitting: Legal Medicine

## 2021-08-21 ENCOUNTER — Encounter: Payer: Self-pay | Admitting: Legal Medicine

## 2021-08-21 VITALS — BP 128/60 | HR 90 | Temp 97.9°F | Ht 70.0 in | Wt 259.2 lb

## 2021-08-21 DIAGNOSIS — R911 Solitary pulmonary nodule: Secondary | ICD-10-CM | POA: Insufficient documentation

## 2021-08-21 DIAGNOSIS — R918 Other nonspecific abnormal finding of lung field: Secondary | ICD-10-CM

## 2021-08-21 DIAGNOSIS — M7552 Bursitis of left shoulder: Secondary | ICD-10-CM | POA: Diagnosis not present

## 2021-08-21 HISTORY — DX: Solitary pulmonary nodule: R91.1

## 2021-08-21 NOTE — Progress Notes (Signed)
Established Patient Office Visit  Subjective:  Patient ID: Marie Cox, female    DOB: 1981/05/22  Age: 40 y.o. MRN: [id number]  CC:  Chief Complaint  Patient presents with   Shoulder Pain    Patient states her left shoulder has been bothering her for a while now. She would like an injection if possible.     HPI Marie Cox presents for increased pain left shoulder for one week.  She is using left arm more.    Dr. Wille Glaser found 2 nodules on CT for calcium score 2020, she has not followed up.  Past Medical History:  Diagnosis Date   Atherosclerosis    GERD (gastroesophageal reflux disease)    Migraines     Past Surgical History:  Procedure Laterality Date   CHOLECYSTECTOMY  2005   TUBAL LIGATION  2006    Family History  Problem Relation Age of Onset   Diabetes Mother    Hypertension Mother    Heart attack Maternal Grandfather    Heart Problems Father    Heart attack Paternal Grandmother    Lung cancer Paternal Grandfather     Social History   Socioeconomic History   Marital status: Media planner    Spouse name: Not on file   Number of children: 1   Years of education: Not on file   Highest education level: Not on file  Occupational History   Occupation: Forensic psychologist  Tobacco Use   Smoking status: Former    Types: Cigarettes    Start date: 04/09/2019   Smokeless tobacco: Never  Vaping Use   Vaping Use: Never used  Substance and Sexual Activity   Alcohol use: Never   Drug use: Never   Sexual activity: Not Currently    Partners: Male  Other Topics Concern   Not on file  Social History Narrative   Not on file   Social Determinants of Health   Financial Resource Strain: Not on file  Food Insecurity: Not on file  Transportation Needs: Not on file  Physical Activity: Not on file  Stress: Not on file  Social Connections: Not on file  Intimate Partner Violence: Not on file    Outpatient Medications Prior to Visit   Medication Sig Dispense Refill   diclofenac Sodium (VOLTAREN) 1 % GEL Apply topically.     lisinopril (ZESTRIL) 10 MG tablet Take 1 tablet (10 mg total) by mouth daily. 90 tablet 3   meloxicam (MOBIC) 15 MG tablet Take 15 mg by mouth daily.     amoxicillin-clavulanate (AUGMENTIN) 875-125 MG tablet Take 1 tablet by mouth 2 (two) times daily. 20 tablet 0   cetirizine (ZYRTEC) 10 MG tablet Take 1 tablet (10 mg total) by mouth daily. 30 tablet 11   methocarbamol (ROBAXIN) 750 MG tablet      omeprazole (PRILOSEC) 40 MG capsule TAKE 1 CAPSULE BY MOUTH ONCE DAILY. 30 capsule 3   atorvastatin (LIPITOR) 10 MG tablet Take 1 tablet (10 mg total) by mouth daily. 90 tablet 1   No facility-administered medications prior to visit.    Allergies  Allergen Reactions   Clindamycin Hives   Morphine And Related     ROS Review of Systems  Constitutional:  Negative for activity change and appetite change.  HENT:  Negative for congestion.   Eyes:  Negative for visual disturbance.  Respiratory:  Negative for chest tightness and shortness of breath.   Cardiovascular:  Negative for chest pain, palpitations and leg swelling.  Gastrointestinal: Negative.  Negative for abdominal distention and abdominal pain.  Genitourinary: Negative.   Musculoskeletal:  Positive for arthralgias.  Neurological: Negative.   Psychiatric/Behavioral: Negative.       Objective:    Physical Exam Vitals reviewed.  Constitutional:      Appearance: Normal appearance.  HENT:     Head: Normocephalic.     Right Ear: Tympanic membrane normal.     Left Ear: Tympanic membrane normal.  Eyes:     Extraocular Movements: Extraocular movements intact.     Conjunctiva/sclera: Conjunctivae normal.     Pupils: Pupils are equal, round, and reactive to light.  Cardiovascular:     Rate and Rhythm: Normal rate and regular rhythm.     Pulses: Normal pulses.     Heart sounds: Normal heart sounds. No murmur heard.   No gallop.  Pulmonary:      Effort: Pulmonary effort is normal. No respiratory distress.     Breath sounds: Normal breath sounds. No wheezing.  Abdominal:     General: Abdomen is flat. Bowel sounds are normal. There is no distension.     Palpations: Abdomen is soft.     Tenderness: There is no abdominal tenderness.  Musculoskeletal:     Left shoulder: Tenderness present. Decreased range of motion. Decreased strength.       Arms:     Cervical back: Normal range of motion. No rigidity.  Lymphadenopathy:     Cervical: No cervical adenopathy.  Skin:    Capillary Refill: Capillary refill takes less than 2 seconds.  Neurological:     General: No focal deficit present.     Mental Status: She is alert and oriented to person, place, and time.    BP 128/60   Pulse 90   Temp 97.9 F (36.6 C)   Ht 5\' 10"  (1.778 m)   Wt 259 lb 3.2 oz (117.6 kg)   SpO2 98%   BMI 37.19 kg/m  Wt Readings from Last 3 Encounters:  08/21/21 259 lb 3.2 oz (117.6 kg)  05/22/21 255 lb (115.7 kg)  04/19/21 265 lb (120.2 kg)     Health Maintenance Due  Topic Date Due   HIV Screening  Never done   Hepatitis C Screening  Never done   PAP SMEAR-Modifier  Never done   INFLUENZA VACCINE  07/23/2021    There are no preventive care reminders to display for this patient.  Lab Results  Component Value Date   TSH 2.020 06/08/2020   No results found for: WBC, HGB, HCT, MCV, PLT Lab Results  Component Value Date   NA 139 06/08/2020   K 4.5 06/08/2020   CO2 22 06/08/2020   GLUCOSE 104 (H) 06/08/2020   BUN 12 06/08/2020   CREATININE 0.95 06/08/2020   BILITOT 1.4 (H) 06/08/2020   ALKPHOS 88 06/08/2020   AST 17 06/08/2020   ALT 18 06/08/2020   PROT 7.0 06/08/2020   ALBUMIN 4.3 06/08/2020   CALCIUM 9.6 06/08/2020   Lab Results  Component Value Date   CHOL 151 11/23/2020   Lab Results  Component Value Date   HDL 51 11/23/2020   Lab Results  Component Value Date   LDLCALC 77 11/23/2020   Lab Results  Component Value  Date   TRIG 134 11/23/2020   Lab Results  Component Value Date   CHOLHDL 3.0 11/23/2020   No results found for: HGBA1C    Assessment & Plan:   Problem List Items Addressed This Visit  Musculoskeletal and Integument   Bursitis of shoulder, left Patient is having left shoulder bursitis and requests injection     Other   Incidental lung nodule Patient was found to have lung nodules on CT chest 2020, no follow up, we will repeat before referral   Other Visit Diagnoses     Lung nodules    -  Primary   Relevant Orders   CT Chest Wo Contrast CT as noted above   After consent was obtained, using sterile technique the left shoulder was prepped and plain Ethyl Chloride  was used as local anesthetic. The joint was entered and   Steroid 80 mg and 3 ml plain Lidocaine was then injected and the needle withdrawn.  The procedure was well tolerated.  The patient is asked to continue to rest the joint for a few more days before resuming regular activities.  It may be more painful for the first 1-2 days.  Watch for fever, or increased swelling or persistent pain in the joint. Call or return to clinic prn if such symptoms occur or there is failure to improve as anticipated.        Follow-up: Return in about 3 months (around 11/21/2021).    Brent Bulla, MD

## 2021-08-21 NOTE — Telephone Encounter (Signed)
   Marie Cox has been scheduled for the following appointment:  WHAT: CHEST CT WHERE: [facility] DATE: 08/31/21 TIME: 10:45 AM ARRIVAL TIME  Patient has been made aware.

## 2021-08-30 ENCOUNTER — Telehealth: Payer: Self-pay

## 2021-08-30 NOTE — Telephone Encounter (Signed)
I left message on voicemail to let the patient know that CT Chest showed nodule stable since 2020 and benign.

## 2021-09-06 NOTE — Telephone Encounter (Signed)
Pt called and pt made aware of results.   Lorita Officer, [place] 09/06/21 4:40 PM

## 2021-10-10 ENCOUNTER — Other Ambulatory Visit: Payer: Self-pay | Admitting: Legal Medicine

## 2021-10-25 ENCOUNTER — Encounter: Payer: Self-pay | Admitting: Legal Medicine

## 2021-10-25 ENCOUNTER — Other Ambulatory Visit: Payer: Self-pay

## 2021-10-25 ENCOUNTER — Ambulatory Visit (INDEPENDENT_AMBULATORY_CARE_PROVIDER_SITE_OTHER): Payer: 59 | Admitting: Legal Medicine

## 2021-10-25 VITALS — BP 108/80 | HR 91 | Temp 97.2°F | Resp 16 | Ht 70.0 in | Wt 250.0 lb

## 2021-10-25 DIAGNOSIS — M19012 Primary osteoarthritis, left shoulder: Secondary | ICD-10-CM

## 2021-10-25 MED ORDER — KETOROLAC TROMETHAMINE 10 MG PO TABS
10.0000 mg | ORAL_TABLET | Freq: Four times a day (QID) | ORAL | 1 refills | Status: DC | PRN
Start: 2021-10-25 — End: 2021-12-04

## 2021-10-25 NOTE — Progress Notes (Signed)
Acute Office Visit  Subjective:    Patient ID: Marie Cox, female    DOB: [birth year], 40 y.o.   MRN: [id number]  Chief Complaint  Patient presents with   Shoulder Pain    Left shoulder pain    HPI: Patient is in today for chronic sharp pain on left shoulder with radiation to back pain since 2 weeks ago is getting worse. Last injection in august.  Some benefit.  Pain on rom left shoulder, massage helped some.  Past Medical History:  Diagnosis Date   Atherosclerosis    GERD (gastroesophageal reflux disease)    Migraines     Past Surgical History:  Procedure Laterality Date   CHOLECYSTECTOMY  2005   TUBAL LIGATION  2006    Family History  Problem Relation Age of Onset   Diabetes Mother    Hypertension Mother    Heart attack Maternal Grandfather    Heart Problems Father    Heart attack Paternal Grandmother    Lung cancer Paternal Grandfather     Social History   Socioeconomic History   Marital status: Media planner    Spouse name: Not on file   Number of children: 1   Years of education: Not on file   Highest education level: Not on file  Occupational History   Occupation: Forensic psychologist  Tobacco Use   Smoking status: Former    Types: Cigarettes    Start date: 04/09/2019   Smokeless tobacco: Never  Vaping Use   Vaping Use: Never used  Substance and Sexual Activity   Alcohol use: Never   Drug use: Never   Sexual activity: Not Currently    Partners: Male  Other Topics Concern   Not on file  Social History Narrative   Not on file   Social Determinants of Health   Financial Resource Strain: Not on file  Food Insecurity: Not on file  Transportation Needs: Not on file  Physical Activity: Not on file  Stress: Not on file  Social Connections: Not on file  Intimate Partner Violence: Not on file    Outpatient Medications Prior to Visit  Medication Sig Dispense Refill   atorvastatin (LIPITOR) 10 MG tablet TAKE 1 TABLET BY MOUTH ONCE DAILY.  30 tablet 0   cetirizine (ZYRTEC) 10 MG tablet TAKE 1 TABLET BY MOUTH ONCE DAILY. 30 tablet 11   diclofenac Sodium (VOLTAREN) 1 % GEL Apply topically.     lisinopril (ZESTRIL) 10 MG tablet Take 1 tablet (10 mg total) by mouth daily. 90 tablet 3   meloxicam (MOBIC) 15 MG tablet Take 15 mg by mouth daily.     omeprazole (PRILOSEC) 40 MG capsule TAKE 1 CAPSULE BY MOUTH ONCE DAILY. 30 capsule 3   No facility-administered medications prior to visit.    Allergies  Allergen Reactions   Clindamycin Hives   Morphine And Related     Review of Systems  Constitutional:  Negative for chills, fatigue and fever.  HENT:  Negative for congestion, ear pain and sore throat.   Respiratory:  Negative for cough and shortness of breath.   Cardiovascular:  Negative for chest pain and palpitations.  Gastrointestinal:  Negative for abdominal pain, constipation, diarrhea, nausea and vomiting.  Endocrine: Negative for polydipsia, polyphagia and polyuria.  Genitourinary:  Negative for difficulty urinating and dysuria.  Musculoskeletal:  Positive for arthralgias (Left shoulder pain). Negative for back pain and myalgias.  Skin:  Negative for rash.  Neurological:  Negative for headaches.  Psychiatric/Behavioral:  Negative for  dysphoric mood. The patient is not nervous/anxious.       Objective:    Physical Exam Vitals reviewed.  Constitutional:      Appearance: Normal appearance.  HENT:     Head: Normocephalic.     Right Ear: Tympanic membrane, ear canal and external ear normal.     Left Ear: Tympanic membrane, ear canal and external ear normal.  Cardiovascular:     Rate and Rhythm: Normal rate and regular rhythm.     Pulses: Normal pulses.     Heart sounds: No murmur heard.   No gallop.  Pulmonary:     Effort: Pulmonary effort is normal.  Musculoskeletal:       Arms:     Comments: Pain left AC joint, full rom shoulder without pain.  Neurologic normal  Neurological:     Mental Status: She is  alert.    BP 108/80   Pulse 91   Temp (!) 97.2 F (36.2 C)   Resp 16   Ht 5\' 10"  (1.778 m)   Wt 250 lb (113.4 kg)   SpO2 98%   BMI 35.87 kg/m  Wt Readings from Last 3 Encounters:  10/25/21 250 lb (113.4 kg)  08/21/21 259 lb 3.2 oz (117.6 kg)  05/22/21 255 lb (115.7 kg)    Health Maintenance Due  Topic Date Due   Pneumococcal Vaccine 19-61 Years old (1 - PCV) Never done   HIV Screening  Never done   Hepatitis C Screening  Never done   PAP SMEAR-Modifier  Never done   INFLUENZA VACCINE  Never done    There are no preventive care reminders to display for this patient.   Lab Results  Component Value Date   TSH 2.020 06/08/2020   No results found for: WBC, HGB, HCT, MCV, PLT Lab Results  Component Value Date   NA 139 06/08/2020   K 4.5 06/08/2020   CO2 22 06/08/2020   GLUCOSE 104 (H) 06/08/2020   BUN 12 06/08/2020   CREATININE 0.95 06/08/2020   BILITOT 1.4 (H) 06/08/2020   ALKPHOS 88 06/08/2020   AST 17 06/08/2020   ALT 18 06/08/2020   PROT 7.0 06/08/2020   ALBUMIN 4.3 06/08/2020   CALCIUM 9.6 06/08/2020   Lab Results  Component Value Date   CHOL 151 11/23/2020   Lab Results  Component Value Date   HDL 51 11/23/2020   Lab Results  Component Value Date   LDLCALC 77 11/23/2020   Lab Results  Component Value Date   TRIG 134 11/23/2020   Lab Results  Component Value Date   CHOLHDL 3.0 11/23/2020   No results found for: HGBA1C     Assessment & Plan:   Problem List Items Addressed This Visit   None Visit Diagnoses     Arthritis of left acromioclavicular joint    -  Primary   Relevant Medications   ketorolac (TORADOL) 10 MG tablet   Other Relevant Orders   DG AC Joints (Completed) Patient given AC injection and toradol for pain.  After consent was obtained, using sterile technique the left AC joint was prepped and Ethyl Chloride was used as local anesthetic. The joint was entered and   Steroid 40 mg and 1 ml plain Lidocaine was then injected  and the needle withdrawn.  The procedure was well tolerated.  The patient is asked to continue to rest the joint for a few more days before resuming regular activities.  It may be more painful for the first  1-2 days.  Watch for fever, or increased swelling or persistent pain in the joint. Call or return to clinic prn if such symptoms occur or there is failure to improve as anticipated.       Meds ordered this encounter  Medications   ketorolac (TORADOL) 10 MG tablet    Sig: Take 1 tablet (10 mg total) by mouth every 6 (six) hours as needed.    Dispense:  20 tablet    Refill:  1     Orders Placed This Encounter  Procedures   DG AC Joints      Follow-up: Return in about 2 weeks (around 11/08/2021) for [facility] joint.  An After Visit Summary was printed and given to the patient.  Brent Bulla, MD [facility] [phone]

## 2021-10-26 ENCOUNTER — Other Ambulatory Visit: Payer: Self-pay

## 2021-10-26 DIAGNOSIS — M19012 Primary osteoarthritis, left shoulder: Secondary | ICD-10-CM

## 2021-11-06 ENCOUNTER — Encounter: Payer: 59 | Admitting: Legal Medicine

## 2021-11-08 ENCOUNTER — Ambulatory Visit: Payer: 59 | Admitting: Legal Medicine

## 2021-11-13 ENCOUNTER — Other Ambulatory Visit: Payer: Self-pay

## 2021-11-13 ENCOUNTER — Encounter: Payer: Self-pay | Admitting: Legal Medicine

## 2021-11-13 ENCOUNTER — Other Ambulatory Visit: Payer: Self-pay | Admitting: Family Medicine

## 2021-11-13 ENCOUNTER — Ambulatory Visit (INDEPENDENT_AMBULATORY_CARE_PROVIDER_SITE_OTHER): Payer: 59 | Admitting: Legal Medicine

## 2021-11-13 VITALS — BP 120/80 | HR 81 | Temp 97.8°F | Resp 16 | Ht 70.0 in | Wt 252.0 lb

## 2021-11-13 DIAGNOSIS — M19012 Primary osteoarthritis, left shoulder: Secondary | ICD-10-CM

## 2021-11-13 DIAGNOSIS — E782 Mixed hyperlipidemia: Secondary | ICD-10-CM

## 2021-11-13 DIAGNOSIS — M7552 Bursitis of left shoulder: Secondary | ICD-10-CM | POA: Diagnosis not present

## 2021-11-13 MED ORDER — ATORVASTATIN CALCIUM 10 MG PO TABS: 10.0000 mg | ORAL_TABLET | Freq: Every day | ORAL | 2 refills | Status: DC

## 2021-11-13 NOTE — Telephone Encounter (Signed)
Your patient. Marie Cox °

## 2021-11-13 NOTE — Progress Notes (Signed)
Acute Office Visit  Subjective:    Patient ID: Marie Cox, female    DOB: 12-Sep-1981, 40 y.o.   MRN: [id number]  Chief Complaint  Patient presents with   Shoulder Pain    Left shoulder pain    HPI: Patient is in today for the [facility] joint is improving but the left shoulder pain is worse in joint.  She continues to work.  Past Medical History:  Diagnosis Date   Atherosclerosis    GERD (gastroesophageal reflux disease)    Migraines     Past Surgical History:  Procedure Laterality Date   CHOLECYSTECTOMY  2005   TUBAL LIGATION  2006    Family History  Problem Relation Age of Onset   Diabetes Mother    Hypertension Mother    Heart attack Maternal Grandfather    Heart Problems Father    Heart attack Paternal Grandmother    Lung cancer Paternal Grandfather     Social History   Socioeconomic History   Marital status: Media planner    Spouse name: Not on file   Number of children: 1   Years of education: Not on file   Highest education level: Not on file  Occupational History   Occupation: Forensic psychologist  Tobacco Use   Smoking status: Former    Types: Cigarettes    Start date: 04/09/2019   Smokeless tobacco: Never  Vaping Use   Vaping Use: Never used  Substance and Sexual Activity   Alcohol use: Never   Drug use: Never   Sexual activity: Not Currently    Partners: Male  Other Topics Concern   Not on file  Social History Narrative   Not on file   Social Determinants of Health   Financial Resource Strain: Not on file  Food Insecurity: Not on file  Transportation Needs: Not on file  Physical Activity: Not on file  Stress: Not on file  Social Connections: Not on file  Intimate Partner Violence: Not on file    Outpatient Medications Prior to Visit  Medication Sig Dispense Refill   cetirizine (ZYRTEC) 10 MG tablet TAKE 1 TABLET BY MOUTH ONCE DAILY. 30 tablet 11   diclofenac Sodium (VOLTAREN) 1 % GEL Apply topically.     ketorolac (TORADOL)  10 MG tablet Take 1 tablet (10 mg total) by mouth every 6 (six) hours as needed. 20 tablet 1   lisinopril (ZESTRIL) 10 MG tablet Take 1 tablet (10 mg total) by mouth daily. 90 tablet 3   meloxicam (MOBIC) 15 MG tablet Take 15 mg by mouth daily.     omeprazole (PRILOSEC) 40 MG capsule TAKE 1 CAPSULE BY MOUTH ONCE DAILY. 30 capsule 3   atorvastatin (LIPITOR) 10 MG tablet TAKE 1 TABLET BY MOUTH ONCE DAILY. 30 tablet 0   No facility-administered medications prior to visit.    Allergies  Allergen Reactions   Clindamycin Hives   Morphine And Related     Review of Systems  Constitutional:  Negative for chills, fatigue and fever.  HENT:  Negative for congestion, ear pain and sore throat.   Respiratory:  Negative for cough and shortness of breath.   Cardiovascular:  Negative for chest pain and palpitations.  Gastrointestinal:  Positive for constipation. Negative for abdominal pain, diarrhea, nausea and vomiting.  Endocrine: Negative for polydipsia, polyphagia and polyuria.  Genitourinary:  Negative for difficulty urinating and dysuria.  Musculoskeletal:  Negative for arthralgias, back pain and myalgias.  Skin:  Negative for rash.  Neurological:  Negative for  headaches.  Psychiatric/Behavioral:  Negative for dysphoric mood. The patient is not nervous/anxious.       Objective:    Physical Exam Vitals reviewed.  Constitutional:      Appearance: Normal appearance. She is obese.  HENT:     Right Ear: Tympanic membrane normal.     Left Ear: Tympanic membrane normal.  Eyes:     Extraocular Movements: Extraocular movements intact.     Conjunctiva/sclera: Conjunctivae normal.     Pupils: Pupils are equal, round, and reactive to light.  Cardiovascular:     Rate and Rhythm: Normal rate and regular rhythm.     Pulses: Normal pulses.     Heart sounds: Normal heart sounds. No murmur heard.   No gallop.  Pulmonary:     Effort: Pulmonary effort is normal. No respiratory distress.     Breath  sounds: Normal breath sounds. No wheezing.  Abdominal:     General: Abdomen is flat. Bowel sounds are normal. There is no distension.     Tenderness: There is no abdominal tenderness.  Musculoskeletal:     Left shoulder: Tenderness present.       Arms:     Cervical back: Normal range of motion and neck supple.  Neurological:     General: No focal deficit present.     Mental Status: She is alert and oriented to person, place, and time. Mental status is at baseline.    BP 120/80   Pulse 81   Temp 97.8 F (36.6 C)   Resp 16   Ht 5\' 10"  (1.778 m)   Wt 252 lb (114.3 kg)   SpO2 96%   BMI 36.16 kg/m  Wt Readings from Last 3 Encounters:  11/13/21 252 lb (114.3 kg)  10/25/21 250 lb (113.4 kg)  08/21/21 259 lb 3.2 oz (117.6 kg)    Health Maintenance Due  Topic Date Due   HIV Screening  Never done   Hepatitis C Screening  Never done   PAP SMEAR-Modifier  Never done   INFLUENZA VACCINE  Never done    There are no preventive care reminders to display for this patient.   Lab Results  Component Value Date   TSH 2.020 06/08/2020   No results found for: WBC, HGB, HCT, MCV, PLT Lab Results  Component Value Date   NA 139 06/08/2020   K 4.5 06/08/2020   CO2 22 06/08/2020   GLUCOSE 104 (H) 06/08/2020   BUN 12 06/08/2020   CREATININE 0.95 06/08/2020   BILITOT 1.4 (H) 06/08/2020   ALKPHOS 88 06/08/2020   AST 17 06/08/2020   ALT 18 06/08/2020   PROT 7.0 06/08/2020   ALBUMIN 4.3 06/08/2020   CALCIUM 9.6 06/08/2020   Lab Results  Component Value Date   CHOL 151 11/23/2020   Lab Results  Component Value Date   HDL 51 11/23/2020   Lab Results  Component Value Date   LDLCALC 77 11/23/2020   Lab Results  Component Value Date   TRIG 134 11/23/2020   Lab Results  Component Value Date   CHOLHDL 3.0 11/23/2020   No results found for: HGBA1C     Assessment & Plan:   Problem List Items Addressed This Visit       Musculoskeletal and Integument   Bursitis of  shoulder, left   Relevant Orders   AMB referral to orthopedics Patient having recurrent bursitis and pain in left shoulder, I suspect rotator cuff dysfunction- refer to orthopedics     Other  Mixed dyslipidemia   Relevant Medications   atorvastatin (LIPITOR) 10 MG tablet AN INDIVIDUAL CARE PLAN for hyperlipidemia/ cholesterol was established and reinforced today.  The patient's status was assessed using clinical findings on exam, lab and other diagnostic tests. The patient's disease status was assessed based on evidence-based guidelines and found to be well controlled. MEDICATIONS were reviewed. SELF MANAGEMENT GOALS have been discussed and patient's success at attaining the goal of low cholesterol was assessed. RECOMMENDATION given include regular exercise 3 days a week and low cholesterol/low fat diet. CLINICAL SUMMARY including written plan to identify barriers unique to the patient due to social or economic  reasons was discussed.    Other Visit Diagnoses     Arthritis of left acromioclavicular joint    -  Primary   Relevant Orders   AMB referral to orthopedics Recurrence of AC pain             Follow-up: Return if symptoms worsen or fail to improve.  An After Visit Summary was printed and given to the patient.  Brent Bulla, MD [facility] [phone]

## 2021-11-28 ENCOUNTER — Other Ambulatory Visit: Payer: Self-pay

## 2021-11-28 ENCOUNTER — Encounter: Payer: Self-pay | Admitting: Legal Medicine

## 2021-11-28 ENCOUNTER — Ambulatory Visit (INDEPENDENT_AMBULATORY_CARE_PROVIDER_SITE_OTHER): Payer: 59 | Admitting: Legal Medicine

## 2021-11-28 VITALS — BP 110/80 | HR 83 | Temp 98.1°F | Resp 16 | Ht 70.0 in | Wt 258.0 lb

## 2021-11-28 DIAGNOSIS — Z0000 Encounter for general adult medical examination without abnormal findings: Secondary | ICD-10-CM

## 2021-11-28 NOTE — Progress Notes (Signed)
Subjective:  Patient ID: Marie Cox, female    DOB: 1981-05-11  Age: 40 y.o. MRN: [id number]  Chief Complaint  Patient presents with   Annual Exam    HPI Well Adult Physical: Patient here for a comprehensive physical exam.The patient reports problems - hyperlipidemia, GERD, hypertension, migraines Do you take any herbs or supplements that were not prescribed by a doctor? no Are you taking calcium supplements? no Are you taking aspirin daily? no  Encounter for general adult medical examination without abnormal findings  Physical ("At Risk" items are starred): Patient's last physical exam was 1 year ago .  Patient is not afflicted from Stress Incontinence and Urge Incontinence  Patient wears a seat belt, has smoke detectors, has carbon monoxide detectors, practices appropriate gun safety, and wears sunscreen with extended sun exposure. Dental Care: biannual cleanings, brushes and flosses daily. Ophthalmology/Optometry: Annual visit.  Hearing loss: none Vision impairments: none  Menarche: 40 yo Menstrual History: regular LMP: none november Pregnancy history: G1P1 Safe at home: yes Self breast exams: yes  Garment/textile technologist Visit from 08/21/2021 in [facility]  PHQ-2 Total Score 0               Social Hx   Social History   Socioeconomic History   Marital status: Media planner    Spouse name: Not on file   Number of children: 1   Years of education: Not on file   Highest education level: Not on file  Occupational History   Occupation: Forensic psychologist  Tobacco Use   Smoking status: Former    Types: Cigarettes    Start date: 04/09/2019   Smokeless tobacco: Never  Vaping Use   Vaping Use: Never used  Substance and Sexual Activity   Alcohol use: Never   Drug use: Never   Sexual activity: Not Currently    Partners: Male  Other Topics Concern   Not on file  Social History Narrative   Not on file   Social Determinants of Health   Financial  Resource Strain: Not on file  Food Insecurity: Not on file  Transportation Needs: Not on file  Physical Activity: Not on file  Stress: Not on file  Social Connections: Not on file   Past Medical History:  Diagnosis Date   Atherosclerosis    GERD (gastroesophageal reflux disease)    Migraines    Past Surgical History:  Procedure Laterality Date   CHOLECYSTECTOMY  2005   TUBAL LIGATION  2006    Family History  Problem Relation Age of Onset   Diabetes Mother    Hypertension Mother    Heart attack Maternal Grandfather    Heart Problems Father    Heart attack Paternal Grandmother    Lung cancer Paternal Grandfather     Review of Systems  Constitutional:  Negative for chills, fatigue and fever.  HENT:  Negative for congestion, ear pain and sore throat.   Respiratory:  Negative for cough and shortness of breath.   Cardiovascular:  Negative for chest pain and palpitations.  Gastrointestinal:  Negative for abdominal pain, constipation, diarrhea, nausea and vomiting.  Endocrine: Negative for polydipsia, polyphagia and polyuria.  Genitourinary:  Negative for difficulty urinating and dysuria.  Musculoskeletal:  Negative for arthralgias, back pain and myalgias.  Skin:  Negative for rash.  Neurological:  Negative for headaches.  Psychiatric/Behavioral:  Negative for dysphoric mood. The patient is not nervous/anxious.     Objective:  BP 110/80   Pulse 83   Temp  98.1 F (36.7 C)   Resp 16   Ht 5\' 10"  (1.778 m)   Wt 258 lb (117 kg)   SpO2 96%   BMI 37.02 kg/m   BP/Weight 11/28/2021 11/13/2021 10/25/2021  Systolic BP 110 120 108  Diastolic BP 80 80 80  Wt. (Lbs) 258 252 250  BMI 37.02 36.16 35.87    Physical Exam Vitals reviewed.  Constitutional:      General: She is not in acute distress.    Appearance: Normal appearance. She is obese.  HENT:     Head: Normocephalic.     Right Ear: Tympanic membrane, ear canal and external ear normal.     Left Ear: Tympanic membrane,  ear canal and external ear normal.     Nose: Nose normal.     Mouth/Throat:     Mouth: Mucous membranes are moist.     Pharynx: Oropharynx is clear.  Eyes:     Extraocular Movements: Extraocular movements intact.     Conjunctiva/sclera: Conjunctivae normal.     Pupils: Pupils are equal, round, and reactive to light.  Cardiovascular:     Rate and Rhythm: Normal rate and regular rhythm.     Pulses: Normal pulses.     Heart sounds: No murmur heard.   No gallop.  Pulmonary:     Effort: Pulmonary effort is normal. No respiratory distress.     Breath sounds: No wheezing.  Abdominal:     General: Abdomen is flat. Bowel sounds are normal. There is no distension.     Palpations: Abdomen is soft.     Tenderness: There is no abdominal tenderness.  Musculoskeletal:        General: Normal range of motion.     Cervical back: Normal range of motion.     Right lower leg: No edema.     Left lower leg: No edema.  Skin:    General: Skin is warm and dry.     Capillary Refill: Capillary refill takes less than 2 seconds.  Neurological:     General: No focal deficit present.     Mental Status: She is alert and oriented to person, place, and time. Mental status is at baseline.     Gait: Gait normal.     Deep Tendon Reflexes: Reflexes normal.  Psychiatric:        Mood and Affect: Mood normal.        Thought Content: Thought content normal.        Judgment: Judgment normal.    Lab Results  Component Value Date   GLUCOSE 104 (H) 06/08/2020   CHOL 151 11/23/2020   TRIG 134 11/23/2020   HDL 51 11/23/2020   LDLCALC 77 11/23/2020   ALT 18 06/08/2020   AST 17 06/08/2020   NA 139 06/08/2020   K 4.5 06/08/2020   CL 102 06/08/2020   CREATININE 0.95 06/08/2020   BUN 12 06/08/2020   CO2 22 06/08/2020   TSH 2.020 06/08/2020      Assessment & Plan:   Problem List Items Addressed This Visit   None Visit Diagnoses     Routine general medical examination at a health care facility    -   Primary   Relevant Orders   Comprehensive metabolic panel   Hemoglobin A1c   Lipid panel   CBC with Differential/Platelet   TSH   Ambulatory referral to Gynecology Routine physical performed         Body mass index is 37.02 kg/m.  These are the goals we discussed:  Goals      Increase physical activity         This is a list of the screening recommended for you and due dates:  Health Maintenance  Topic Date Due   HIV Screening  Never done   Hepatitis C Screening: USPSTF Recommendation to screen - Ages 63-79 yo.  Never done   Pap Smear  Never done   Flu Shot  Never done   COVID-19 Vaccine (1) 06/07/2022*   Mammogram  05/14/2022   Tetanus Vaccine  05/23/2031   Pneumococcal Vaccination  Aged Out   HPV Vaccine  Aged Out  *Topic was postponed. The date shown is not the original due date.        Follow-up: Return in 1 year (on 11/28/2022), or if symptoms worsen or fail to improve.  An After Visit Summary was printed and given to the patient.  Brent Bulla, MD [facility] [phone]

## 2021-11-29 LAB — COMPREHENSIVE METABOLIC PANEL
ALT: 17 IU/L (ref 0–32)
AST: 14 IU/L (ref 0–40)
Albumin/Globulin Ratio: 1.5 (ref 1.2–2.2)
Albumin: 3.9 g/dL (ref 3.8–4.8)
Alkaline Phosphatase: 85 IU/L (ref 44–121)
BUN/Creatinine Ratio: 17 (ref 9–23)
BUN: 15 mg/dL (ref 6–24)
Bilirubin Total: 0.8 mg/dL (ref 0.0–1.2)
CO2: 20 mmol/L (ref 20–29)
Calcium: 9.1 mg/dL (ref 8.7–10.2)
Chloride: 106 mmol/L (ref 96–106)
Creatinine, Ser: 0.9 mg/dL (ref 0.57–1.00)
Globulin, Total: 2.6 g/dL (ref 1.5–4.5)
Glucose: 95 mg/dL (ref 70–99)
Potassium: 4.4 mmol/L (ref 3.5–5.2)
Sodium: 139 mmol/L (ref 134–144)
Total Protein: 6.5 g/dL (ref 6.0–8.5)
eGFR: 82 mL/min/{1.73_m2} (ref >59)

## 2021-11-29 LAB — CBC WITH DIFFERENTIAL/PLATELET
Basophils Absolute: 0.1 10*3/uL (ref 0.0–0.2)
Basos: 1 %
EOS (ABSOLUTE): 0.2 10*3/uL (ref 0.0–0.4)
Eos: 4 %
Hematocrit: 41.7 % (ref 34.0–46.6)
Hemoglobin: 13.7 g/dL (ref 11.1–15.9)
Immature Grans (Abs): 0 10*3/uL (ref 0.0–0.1)
Immature Granulocytes: 1 %
Lymphocytes Absolute: 1.6 10*3/uL (ref 0.7–3.1)
Lymphs: 26 %
MCH: 28.7 pg (ref 26.6–33.0)
MCHC: 32.9 g/dL (ref 31.5–35.7)
MCV: 87 fL (ref 79–97)
Monocytes Absolute: 0.6 10*3/uL (ref 0.1–0.9)
Monocytes: 9 %
Neutrophils Absolute: 3.7 10*3/uL (ref 1.4–7.0)
Neutrophils: 59 %
Platelets: 310 10*3/uL (ref 150–450)
RBC: 4.78 x10E6/uL (ref 3.77–5.28)
RDW: 14.1 % (ref 11.7–15.4)
WBC: 6.1 10*3/uL (ref 3.4–10.8)

## 2021-11-29 LAB — LIPID PANEL
Chol/HDL Ratio: 2.5 ratio (ref 0.0–4.4)
Cholesterol, Total: 151 mg/dL (ref 100–199)
HDL: 60 mg/dL (ref >39)
LDL Chol Calc (NIH): 71 mg/dL (ref 0–99)
Triglycerides: 110 mg/dL (ref 0–149)
VLDL Cholesterol Cal: 20 mg/dL (ref 5–40)

## 2021-11-29 LAB — HEMOGLOBIN A1C
Est. average glucose Bld gHb Est-mCnc: 120 mg/dL
Hgb A1c MFr Bld: 5.8 % — ABNORMAL HIGH (ref 4.8–5.6)

## 2021-11-29 LAB — CARDIOVASCULAR RISK ASSESSMENT

## 2021-11-29 LAB — TSH: TSH: 2.31 u[IU]/mL (ref 0.450–4.500)

## 2021-11-29 NOTE — Progress Notes (Signed)
Kidney and liver tests normal, A1c 5.8, cholesterol normal, CBC normal, TSH 2.3 normal lp

## 2021-12-04 ENCOUNTER — Other Ambulatory Visit: Payer: Self-pay

## 2021-12-04 DIAGNOSIS — I7090 Unspecified atherosclerosis: Secondary | ICD-10-CM | POA: Insufficient documentation

## 2021-12-04 DIAGNOSIS — G43909 Migraine, unspecified, not intractable, without status migrainosus: Secondary | ICD-10-CM | POA: Insufficient documentation

## 2021-12-13 ENCOUNTER — Other Ambulatory Visit: Payer: Self-pay

## 2021-12-13 ENCOUNTER — Encounter: Payer: Self-pay | Admitting: Cardiology

## 2021-12-13 ENCOUNTER — Ambulatory Visit (INDEPENDENT_AMBULATORY_CARE_PROVIDER_SITE_OTHER): Payer: 59 | Admitting: Cardiology

## 2021-12-13 VITALS — BP 118/88 | HR 84 | Ht 70.0 in | Wt 255.2 lb

## 2021-12-13 DIAGNOSIS — E782 Mixed hyperlipidemia: Secondary | ICD-10-CM

## 2021-12-13 DIAGNOSIS — I7090 Unspecified atherosclerosis: Secondary | ICD-10-CM | POA: Diagnosis not present

## 2021-12-13 DIAGNOSIS — I10 Essential (primary) hypertension: Secondary | ICD-10-CM | POA: Diagnosis not present

## 2021-12-13 DIAGNOSIS — I2510 Atherosclerotic heart disease of native coronary artery without angina pectoris: Secondary | ICD-10-CM | POA: Diagnosis not present

## 2021-12-13 DIAGNOSIS — I2584 Coronary atherosclerosis due to calcified coronary lesion: Secondary | ICD-10-CM

## 2021-12-13 NOTE — Progress Notes (Signed)
Cardiology Office Note:    Date:  12/13/2021   ID:  MALEENA EDDLEMAN, DOB 1981-02-26, MRN [id number]  PCP:  Abigail Miyamoto, MD  Cardiologist:  Gypsy Balsam, MD    Referring MD: Abigail Miyamoto,*   No chief complaint on file. Doing well  History of Present Illness:    TACORA ATHANAS is a 40 y.o. female   with past medical history significant for calcification of the coronary arteries.  In August 2020 she did have a calcium score done which showed small discrete calcium deposit in the mid LAD her coronary calcium score at that time was 7.  She was appropriately put on aspirin as well as cholesterol-lowering medication she been doing well since then.  She is coming today to my office for follow-up.  Overall she seems to be doing well.  She denies have any chest pain tightness squeezing pressure burning chest last year when I saw her she did have some injury to the knee and she had difficulty exercising but now she is back to her routine she exercise about twice a week, I encouraged her to be able be more active and exercise 5 times a week for at least 30 minutes.  She does not smoke she takes her medications.  Past Medical History:  Diagnosis Date   Acute medial meniscal tear 10/02/2020   Agatston coronary artery calcium score less than 100 12/10/2019   Anxiety 10/04/2016   Atherosclerosis    Atypical chest pain 06/24/2019   Bilateral carpal tunnel syndrome 01/30/2021   Bursitis of shoulder, left 04/19/2021   Cellulitis 05/22/2021   Coronary artery calcification 12/10/2019   DDD (degenerative disc disease), lumbar 09/03/2016   Encounter for screening mammogram for malignant neoplasm of breast 08/24/2020   Essential hypertension 07/18/2016   Ex-smoker 12/10/2019   Frequent headaches 08/24/2020   Gastroesophageal reflux disease without esophagitis 07/18/2016   Hand numbness 01/30/2021   Incidental lung nodule 08/21/2021   Intractable migraine with aura without status  migrainosus 07/18/2016   Menorrhagia with regular cycle 07/18/2016   Migraines    Mixed dyslipidemia 12/10/2019   Pulmonary nodules/lesions, multiple 11/23/2019    Past Surgical History:  Procedure Laterality Date   CHOLECYSTECTOMY  2005   TUBAL LIGATION  2006    Current Medications: Current Meds  Medication Sig   aspirin EC 81 MG tablet Take 81 mg by mouth daily.   atorvastatin (LIPITOR) 10 MG tablet Take 1 tablet (10 mg total) by mouth daily.   cetirizine (ZYRTEC) 10 MG tablet TAKE 1 TABLET BY MOUTH ONCE DAILY.   diclofenac Sodium (VOLTAREN) 1 % GEL Apply 1 application topically daily.   ketorolac (TORADOL) 10 MG tablet Take 10 mg by mouth every 6 (six) hours as needed for pain.   lisinopril (ZESTRIL) 10 MG tablet Take 1 tablet (10 mg total) by mouth daily.   meloxicam (MOBIC) 15 MG tablet Take 15 mg by mouth daily.   Multiple Vitamins-Minerals (ZINC PO) Take 1 capsule by mouth daily.   omeprazole (PRILOSEC) 40 MG capsule TAKE 1 CAPSULE BY MOUTH ONCE DAILY.   VITAMIN D PO Take 1 capsule by mouth daily.     Allergies:   Clindamycin and Morphine and related   Social History   Socioeconomic History   Marital status: Media planner    Spouse name: Not on file   Number of children: 1   Years of education: Not on file   Highest education level: Not on file  Occupational History  Occupation: Forensic psychologist  Tobacco Use   Smoking status: Former    Types: Cigarettes    Start date: 04/09/2019   Smokeless tobacco: Never  Vaping Use   Vaping Use: Never used  Substance and Sexual Activity   Alcohol use: Never   Drug use: Never   Sexual activity: Not Currently    Partners: Male  Other Topics Concern   Not on file  Social History Narrative   Not on file   Social Determinants of Health   Financial Resource Strain: Not on file  Food Insecurity: Not on file  Transportation Needs: Not on file  Physical Activity: Not on file  Stress: Not on file  Social Connections: Not  on file     Family History: The patient's family history includes Diabetes in her mother; Heart Problems in her father; Heart attack in her maternal grandfather and paternal grandmother; Hypertension in her mother; Lung cancer in her paternal grandfather. ROS:   Please see the history of present illness.    All 14 point review of systems negative except as described per history of present illness  EKGs/Labs/Other Studies Reviewed:      Recent Labs: 11/28/2021: ALT 17; BUN 15; Creatinine, Ser 0.90; Hemoglobin 13.7; Platelets 310; Potassium 4.4; Sodium 139; TSH 2.310  Recent Lipid Panel    Component Value Date/Time   CHOL 151 11/28/2021 1026   TRIG 110 11/28/2021 1026   HDL 60 11/28/2021 1026   CHOLHDL 2.5 11/28/2021 1026   LDLCALC 71 11/28/2021 1026    Physical Exam:    VS:  BP 118/88    Pulse 84    Ht 5\' 10"  (1.778 m)    Wt 255 lb 3.2 oz (115.8 kg)    SpO2 93%    BMI 36.62 kg/m     Wt Readings from Last 3 Encounters:  12/13/21 255 lb 3.2 oz (115.8 kg)  11/28/21 258 lb (117 kg)  11/13/21 252 lb (114.3 kg)     GEN:  Well nourished, well developed in no acute distress HEENT: Normal NECK: No JVD; No carotid bruits LYMPHATICS: No lymphadenopathy CARDIAC: RRR, no murmurs, no rubs, no gallops RESPIRATORY:  Clear to auscultation without rales, wheezing or rhonchi  ABDOMEN: Soft, non-tender, non-distended MUSCULOSKELETAL:  No edema; No deformity  SKIN: Warm and dry LOWER EXTREMITIES: no swelling NEUROLOGIC:  Alert and oriented x 3 PSYCHIATRIC:  Normal affect   ASSESSMENT:    1. Essential hypertension   2. Coronary artery calcification   3. Atherosclerosis   4. Mixed dyslipidemia    PLAN:    In order of problems listed above:  Calcification of the coronary artery only very mild with calcium score of 7.  She is on antiplatelet therapy, she is on a statin with LDL of 71 from K PN from November 28, 2021 we will continue present management.  Asymptomatic Atherosclerosis  we did talk about her risk modification she is doing excellent job, she quit smoking she is taking antiplatelet therapy is taking statin and she is trying to exercise and be more active. Mixed dyslipidemia K PN show me her LDL of 71 HDL 68 this is from 11/28/2021.  We will continue present management.   Medication Adjustments/Labs and Tests Ordered: Current medicines are reviewed at length with the patient today.  Concerns regarding medicines are outlined above.  Orders Placed This Encounter  Procedures   EKG 12-Lead   Medication changes: No orders of the defined types were placed in this encounter.   Signed, 14/06/2021  Abelino Derrick, MD, [facility] 12/13/2021 11:37 AM    [facility]

## 2021-12-13 NOTE — Patient Instructions (Signed)
Medication Instructions:  °Your physician recommends that you continue on your current medications as directed. Please refer to the Current Medication list given to you today. ° °*If you need a refill on your cardiac medications before your next appointment, please call your pharmacy* ° ° °Lab Work: °None ordered °If you have labs (blood work) drawn today and your tests are completely normal, you will receive your results only by: °MyChart Message (if you have MyChart) OR °A paper copy in the mail °If you have any lab test that is abnormal or we need to change your treatment, we will call you to review the results. ° ° °Testing/Procedures: °None ordered ° ° °Follow-Up: °At [facility], you and your health needs are our priority.  As part of our continuing mission to provide you with exceptional heart care, we have created designated Provider Care Teams.  These Care Teams include your primary Cardiologist (physician) and Advanced Practice Providers (APPs -  Physician Assistants and Nurse Practitioners) who all work together to provide you with the care you need, when you need it. ° °We recommend signing up for the patient portal called "MyChart".  Sign up information is provided on this After Visit Summary.  MyChart is used to connect with patients for Virtual Visits (Telemedicine).  Patients are able to view lab/test results, encounter notes, upcoming appointments, etc.  Non-urgent messages can be sent to your provider as well.   °To learn more about what you can do with MyChart, go to [email or web address].   ° °Your next appointment:   °12 month(s) ° °The format for your next appointment:   °In Person ° °Provider:   °Robert Krasowski, MD ° ° °Other Instructions °NA  °

## 2021-12-14 HISTORY — PX: CARPAL TUNNEL RELEASE: SHX101

## 2021-12-19 ENCOUNTER — Other Ambulatory Visit: Payer: Self-pay | Admitting: Legal Medicine

## 2021-12-26 ENCOUNTER — Other Ambulatory Visit: Payer: 59

## 2022-02-26 ENCOUNTER — Ambulatory Visit (INDEPENDENT_AMBULATORY_CARE_PROVIDER_SITE_OTHER): Payer: 59 | Admitting: Legal Medicine

## 2022-02-26 ENCOUNTER — Encounter: Payer: Self-pay | Admitting: Legal Medicine

## 2022-02-26 VITALS — BP 120/80 | HR 93 | Temp 98.5°F | Resp 15 | Ht 70.0 in | Wt 258.0 lb

## 2022-02-26 DIAGNOSIS — J302 Other seasonal allergic rhinitis: Secondary | ICD-10-CM | POA: Diagnosis not present

## 2022-02-26 MED ORDER — TRIAMCINOLONE ACETONIDE 40 MG/ML IJ SUSP: 60.0000 mg | Freq: Once | INTRAMUSCULAR | Status: DC

## 2022-02-26 MED ORDER — TRIAMCINOLONE ACETONIDE 40 MG/ML IJ SUSP
60.0000 mg | Freq: Once | INTRAMUSCULAR | Status: AC
  Administered 2022-02-26: 60 mg via INTRAMUSCULAR

## 2022-02-26 NOTE — Progress Notes (Signed)
? ?Acute Office Visit ? ?Subjective:  ? ? Patient ID: Marie Cox, female    DOB: 13-Nov-1981, 41 y.o.   MRN: [id number] ? ?Chief Complaint  ?Patient presents with  ? Sore Throat  ? Ear Pain  ?  Right  ? ? ?HPI: ?Patient is in today for sore throat since yesterday. She mentioned sharp pain on right ear and radiation to the right side of the throat. She denied any discharge, fever, chills, headache.  ? ?Past Medical History:  ?Diagnosis Date  ? Acute medial meniscal tear 10/02/2020  ? Agatston coronary artery calcium score less than 100 12/10/2019  ? Anxiety 10/04/2016  ? Atherosclerosis   ? Atypical chest pain 06/24/2019  ? Bilateral carpal tunnel syndrome 01/30/2021  ? Bursitis of shoulder, left 04/19/2021  ? Cellulitis 05/22/2021  ? Coronary artery calcification 12/10/2019  ? DDD (degenerative disc disease), lumbar 09/03/2016  ? Encounter for screening mammogram for malignant neoplasm of breast 08/24/2020  ? Essential hypertension 07/18/2016  ? Ex-smoker 12/10/2019  ? Frequent headaches 08/24/2020  ? Gastroesophageal reflux disease without esophagitis 07/18/2016  ? Hand numbness 01/30/2021  ? Incidental lung nodule 08/21/2021  ? Intractable migraine with aura without status migrainosus 07/18/2016  ? Menorrhagia with regular cycle 07/18/2016  ? Migraines   ? Mixed dyslipidemia 12/10/2019  ? Pulmonary nodules/lesions, multiple 11/23/2019  ? ? ?Past Surgical History:  ?Procedure Laterality Date  ? CARPAL TUNNEL RELEASE Right 07/02/2021  ? CARPAL TUNNEL RELEASE  12/14/2021  ? CHOLECYSTECTOMY  2005  ? TUBAL LIGATION  2006  ? ? ?Family History  ?Problem Relation Age of Onset  ? Diabetes Mother   ? Hypertension Mother   ? Heart attack Maternal Grandfather   ? Heart Problems Father   ? Heart attack Paternal Grandmother   ? Lung cancer Paternal Grandfather   ? ? ?Social History  ? ?Socioeconomic History  ? Marital status: Soil scientist  ?  Spouse name: Not on file  ? Number of children: 1  ? Years of education: Not on file  ?  Highest education level: Not on file  ?Occupational History  ? Occupation: Psychiatrist  ?Tobacco Use  ? Smoking status: Former  ?  Types: Cigarettes  ?  Start date: 04/09/2019  ? Smokeless tobacco: Never  ?Vaping Use  ? Vaping Use: Never used  ?Substance and Sexual Activity  ? Alcohol use: Never  ? Drug use: Never  ? Sexual activity: Not Currently  ?  Partners: Male  ?Other Topics Concern  ? Not on file  ?Social History Narrative  ? Not on file  ? ?Social Determinants of Health  ? ?Financial Resource Strain: Not on file  ?Food Insecurity: Not on file  ?Transportation Needs: Not on file  ?Physical Activity: Not on file  ?Stress: Not on file  ?Social Connections: Not on file  ?Intimate Partner Violence: Not on file  ? ? ?Outpatient Medications Prior to Visit  ?Medication Sig Dispense Refill  ? aspirin EC 81 MG tablet Take 81 mg by mouth daily.    ? atorvastatin (LIPITOR) 10 MG tablet Take 1 tablet (10 mg total) by mouth daily. 90 tablet 2  ? cetirizine (ZYRTEC) 10 MG tablet TAKE 1 TABLET BY MOUTH ONCE DAILY. 30 tablet 11  ? diclofenac Sodium (VOLTAREN) 1 % GEL Apply 1 application topically daily.    ? lisinopril (ZESTRIL) 10 MG tablet Take 1 tablet (10 mg total) by mouth daily. 90 tablet 3  ? meloxicam (MOBIC) 15 MG tablet Take 15  mg by mouth daily.    ? Multiple Vitamins-Minerals (ZINC PO) Take 1 capsule by mouth daily.    ? omeprazole (PRILOSEC) 40 MG capsule TAKE 1 CAPSULE BY MOUTH ONCE DAILY. 30 capsule 3  ? VITAMIN D PO Take 1 capsule by mouth daily.    ? ketorolac (TORADOL) 10 MG tablet Take 10 mg by mouth every 6 (six) hours as needed for pain.    ? ?No facility-administered medications prior to visit.  ? ? ?Allergies  ?Allergen Reactions  ? Clindamycin Hives  ? Morphine And Related   ? ? ?Review of Systems  ?Constitutional:  Negative for chills, fatigue and fever.  ?HENT:  Positive for congestion, ear pain (right ear pain) and sore throat (right sided of the throat).   ?Eyes:  Negative for visual  disturbance.  ?Respiratory:  Negative for cough and shortness of breath.   ?Cardiovascular:  Negative for chest pain and palpitations.  ?Gastrointestinal:  Negative for abdominal pain, constipation, diarrhea, nausea and vomiting.  ?Endocrine: Negative for polydipsia, polyphagia and polyuria.  ?Genitourinary:  Negative for difficulty urinating and dysuria.  ?Musculoskeletal:  Negative for arthralgias, back pain and myalgias.  ?Skin:  Negative for rash.  ?Neurological:  Negative for headaches.  ?Psychiatric/Behavioral:  Negative for dysphoric mood. The patient is not nervous/anxious.   ? ?   ?Objective:  ?  ?Physical Exam ?Vitals reviewed.  ?Constitutional:   ?   General: She is not in acute distress. ?   Appearance: She is well-developed.  ?HENT:  ?   Head: Normocephalic.  ?   Left Ear: Tympanic membrane normal.  ?   Ears:  ?   Comments: Right ear SOM, immobile TM ?   Nose: Nose normal.  ?   Mouth/Throat:  ?   Mouth: Mucous membranes are moist.  ?   Pharynx: Oropharynx is clear.  ?Eyes:  ?   Conjunctiva/sclera: Conjunctivae normal.  ?   Pupils: Pupils are equal, round, and reactive to light.  ?Cardiovascular:  ?   Rate and Rhythm: Normal rate and regular rhythm.  ?   Heart sounds: No murmur heard. ?  No gallop.  ?Pulmonary:  ?   Effort: Pulmonary effort is normal. No respiratory distress.  ?   Breath sounds: Normal breath sounds. No wheezing.  ?Abdominal:  ?   General: Abdomen is flat. Bowel sounds are normal.  ?Neurological:  ?   Mental Status: She is alert.  ? ? ?BP 120/80   Pulse 93   Temp 98.5 ?F (36.9 ?C)   Resp 15   Ht _0  (1.778 m)   Wt 258 lb (117 kg)   SpO2 97%   BMI 37.02 kg/m?  ?Wt Readings from Last 3 Encounters:  ?02/26/22 258 lb (117 kg)  ?12/13/21 255 lb 3.2 oz (115.8 kg)  ?11/28/21 258 lb (117 kg)  ? ? ?Health Maintenance Due  ?Topic Date Due  ? HIV Screening  Never done  ? Hepatitis C Screening  Never done  ? PAP SMEAR-Modifier  Never done  ? INFLUENZA VACCINE  Never done  ? ? ?There are no  preventive care reminders to display for this patient. ? ? ?Lab Results  ?Component Value Date  ? TSH 2.310 11/28/2021  ? ?Lab Results  ?Component Value Date  ? WBC 6.1 11/28/2021  ? HGB 13.7 11/28/2021  ? HCT 41.7 11/28/2021  ? MCV 87 11/28/2021  ? PLT 310 11/28/2021  ? ?Lab Results  ?Component Value Date  ? NA 139 11/28/2021  ?  K 4.4 11/28/2021  ? CO2 20 11/28/2021  ? GLUCOSE 95 11/28/2021  ? BUN 15 11/28/2021  ? CREATININE 0.90 11/28/2021  ? BILITOT 0.8 11/28/2021  ? ALKPHOS 85 11/28/2021  ? AST 14 11/28/2021  ? ALT 17 11/28/2021  ? PROT 6.5 11/28/2021  ? ALBUMIN 3.9 11/28/2021  ? CALCIUM 9.1 11/28/2021  ? EGFR 82 11/28/2021  ? ?Lab Results  ?Component Value Date  ? CHOL 151 11/28/2021  ? ?Lab Results  ?Component Value Date  ? HDL 60 11/28/2021  ? ?Lab Results  ?Component Value Date  ? [place] 71 11/28/2021  ? ?Lab Results  ?Component Value Date  ? TRIG 110 11/28/2021  ? ?Lab Results  ?Component Value Date  ? CHOLHDL 2.5 11/28/2021  ? ?Lab Results  ?Component Value Date  ? HGBA1C 5.8 (H) 11/28/2021  ? ? ?   ?Assessment & Plan:  ? ?Problem List Items Addressed This Visit   ?None ?Visit Diagnoses   ? ? Seasonal allergies    -  Primary  ? Relevant Medications  ? triamcinolone acetonide (KENALOG-40) injection 60 mg (Completed) ?Patient is having seasonal allergies with right eustation tube dysfunction with some serous otitis on right  ? ?  ? ?Meds ordered this encounter  ?Medications  ? DISCONTD: triamcinolone acetonide (KENALOG-40) injection 60 mg  ? triamcinolone acetonide (KENALOG-40) injection 60 mg  ? ? ?  ? ?Follow-up: Return if symptoms worsen or fail to improve. ? ?An After Visit Summary was printed and given to the patient. ? ?Reinaldo Meeker, MD ?[facility] ?[phone]

## 2022-03-04 IMAGING — US US BREAST*L* LIMITED INC AXILLA
1 series · 13 of 17 positions shown · non-contrast
Comparison: Previous exam(s).

CLINICAL DATA: Screening recall for possible left breast masses.

EXAM:
DIGITAL DIAGNOSTIC UNILATERAL LEFT MAMMOGRAM WITH TOMOSYNTHESIS AND
CAD; ULTRASOUND LEFT BREAST LIMITED
TECHNIQUE: Left digital diagnostic mammography and breast tomosynthesis was
performed. The images were evaluated with computer-aided detection.;
Targeted ultrasound examination of the left breast was performed

[Series 1: us breast*left* limited inc axilla · 0.08mm/px · 13 of 17 slices shown]
[im 1/17]
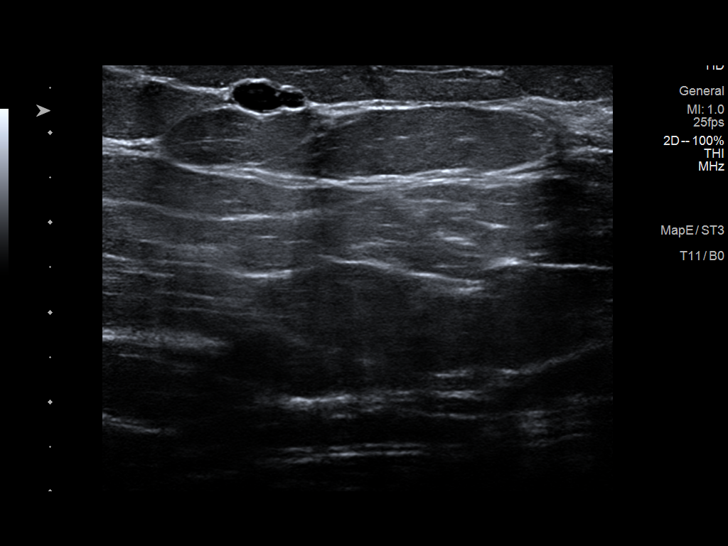
[im 2/17]
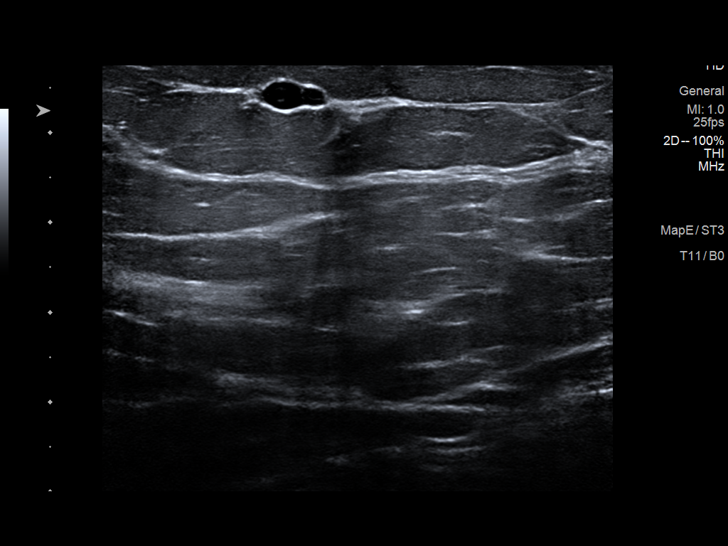
[im 4/17]
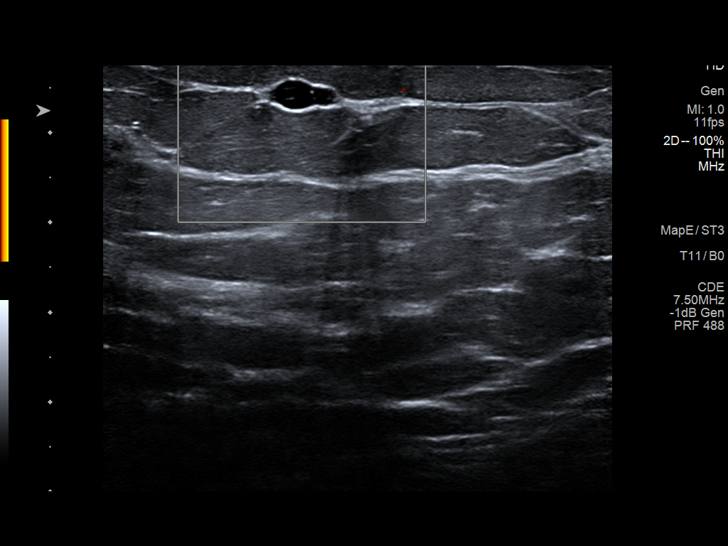
[im 5/17]
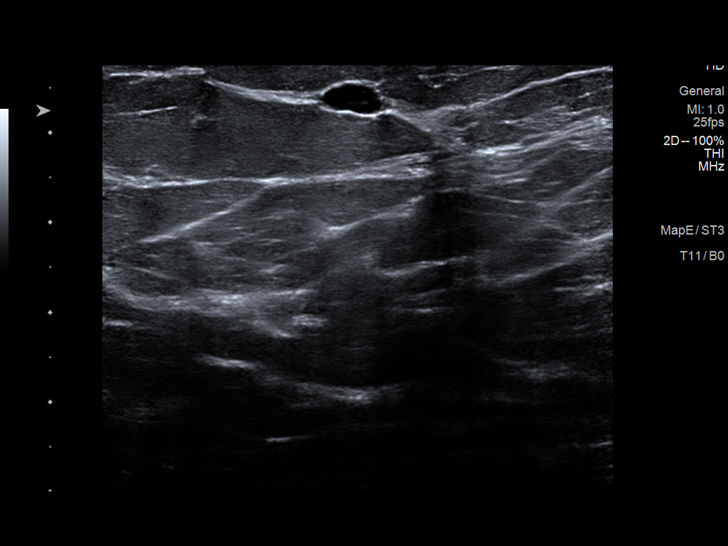
[im 6/17]
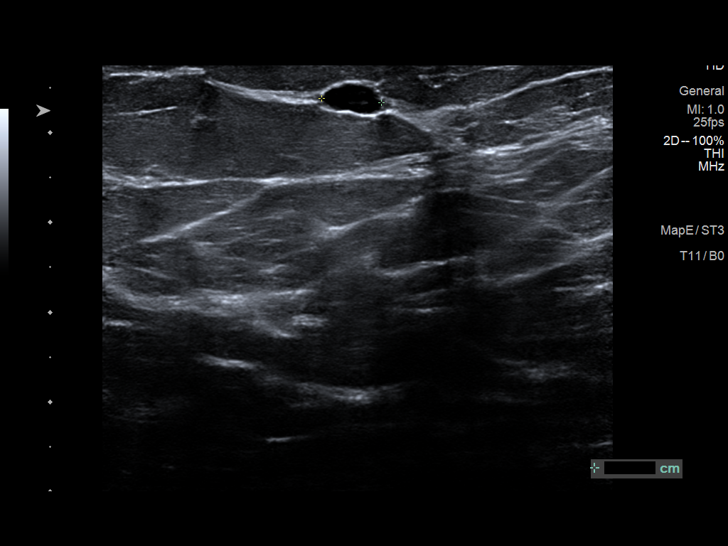
[im 8/17]
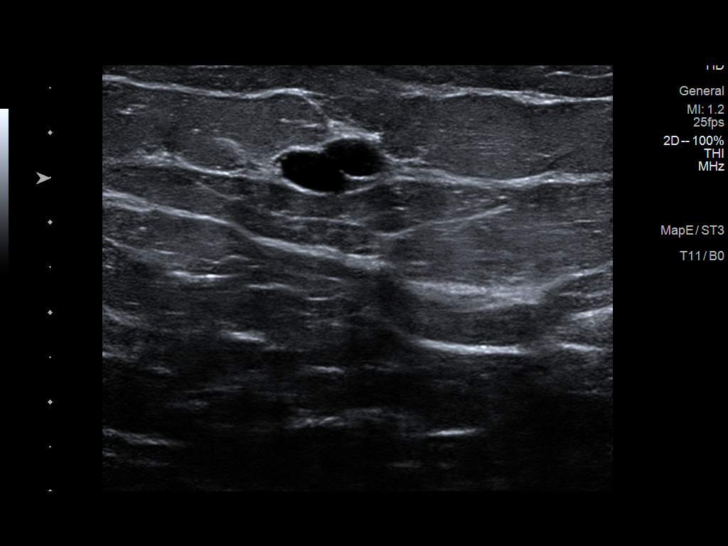
[im 9/17]
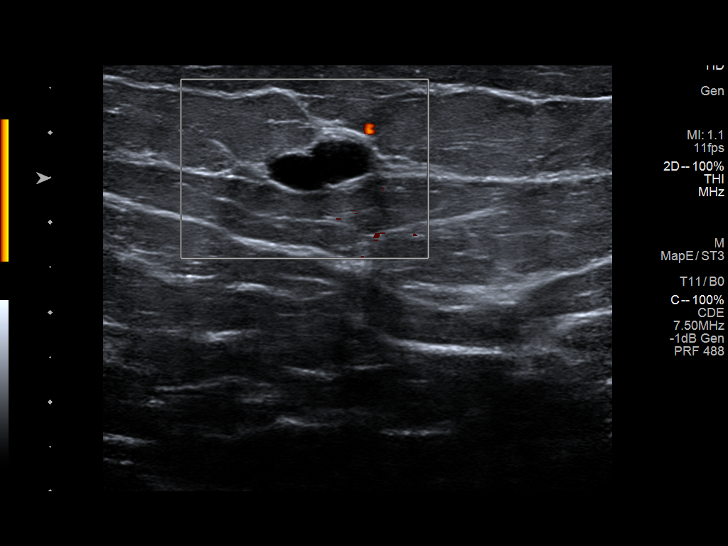
[im 10/17]
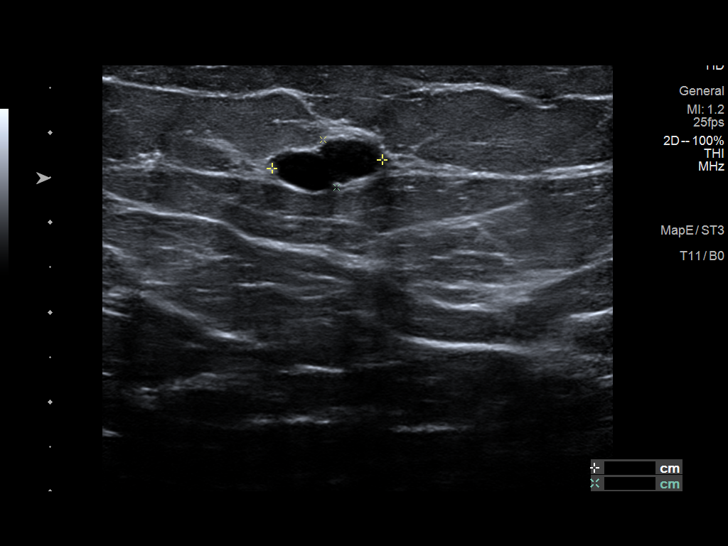
[im 12/17]
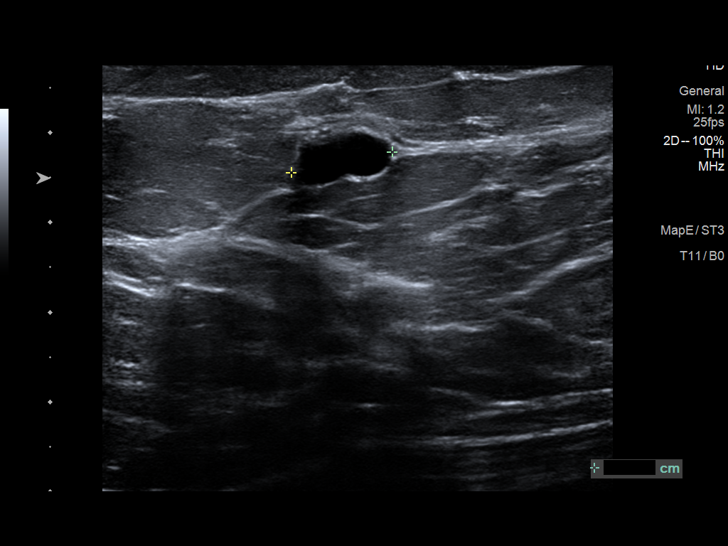
[im 13/17]
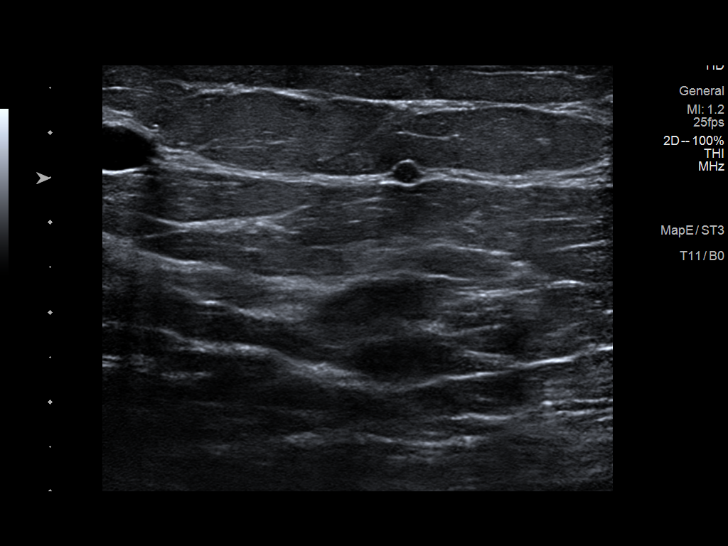
[im 14/17]
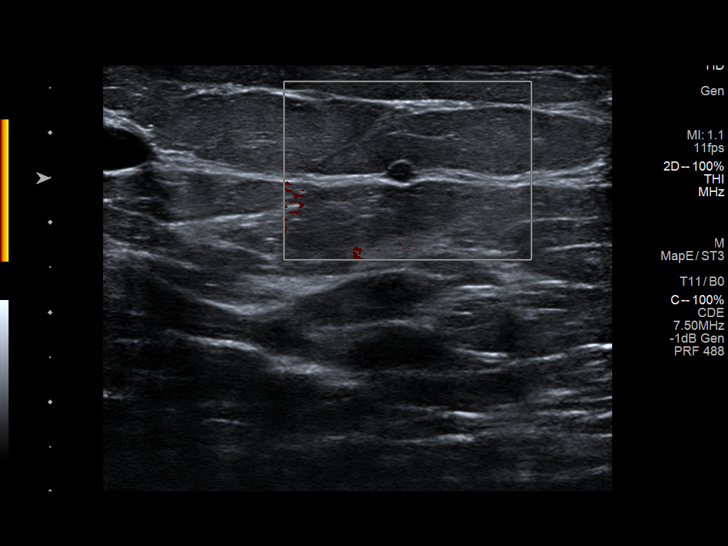
[im 16/17]
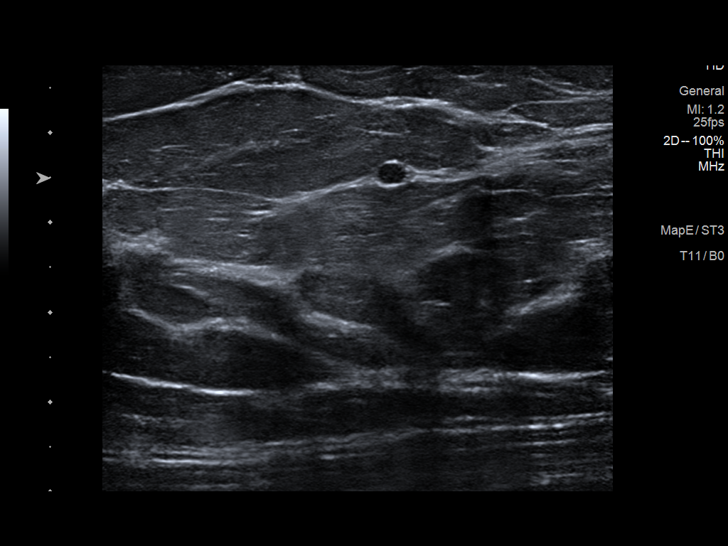
[im 17/17]
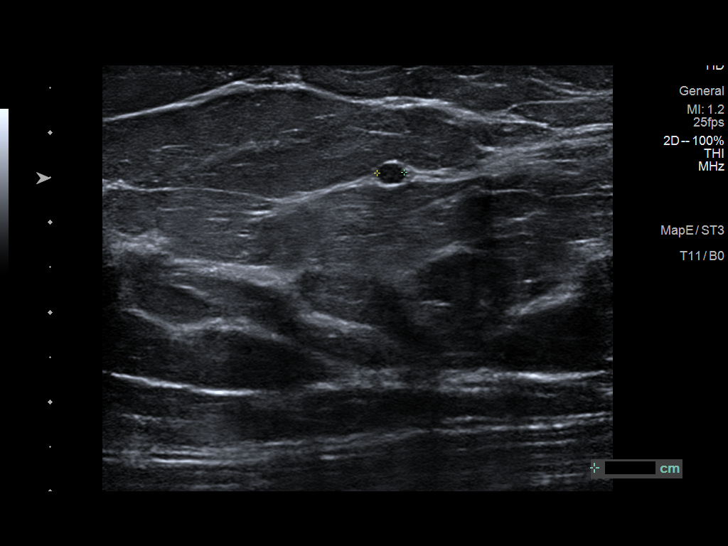

[13 of 17 positions shown; findings below may reference images not displayed]

ACR Breast Density Category b: There are scattered areas of
fibroglandular density.
FINDINGS: Spot tomosynthesis images through the left breast demonstrates 2
persistent oval circumscribed masses in the superior to upper-outer
quadrant, anterior depth.

Ultrasound targeted to the left breast at 12 o'clock, 2 cm from the
nipple demonstrates an anechoic oval circumscribed mass measuring
0.7 x 0.3 x 0.7 cm. Adjacent to this is a similar appearing but
bilobed cyst measuring 1.2 x 0.6 x 1.2 cm. At 2 o'clock, 2 cm from
the nipple there is a oval circumscribed hypoechoic mass measuring 3
mm.
IMPRESSION: 1. There is a likely benign 3 mm mass in the left breast at 2
o'clock, 2 cm from the nipple, favored to represent a complicated
cyst.

2.  There are 2 benign cysts in the left breast at 12 o'clock.

RECOMMENDATION:
Six-month follow-up left breast ultrasound.

I have discussed the findings and recommendations with the patient.
If applicable, a reminder letter will be sent to the patient
regarding the next appointment.

BI-RADS CATEGORY  3: Probably benign.

## 2022-04-09 ENCOUNTER — Other Ambulatory Visit: Payer: Self-pay | Admitting: Legal Medicine

## 2022-04-09 DIAGNOSIS — I10 Essential (primary) hypertension: Secondary | ICD-10-CM

## 2022-04-09 NOTE — Telephone Encounter (Signed)
Refill sent to pharmacy.   

## 2022-04-24 ENCOUNTER — Other Ambulatory Visit: Payer: Self-pay | Admitting: Legal Medicine

## 2022-04-24 NOTE — Telephone Encounter (Signed)
Refill sent to pharmacy.   

## 2022-07-02 ENCOUNTER — Ambulatory Visit (INDEPENDENT_AMBULATORY_CARE_PROVIDER_SITE_OTHER): Payer: 59 | Admitting: Physician Assistant

## 2022-07-02 ENCOUNTER — Encounter: Payer: Self-pay | Admitting: Physician Assistant

## 2022-07-02 VITALS — BP 122/84 | HR 73 | Temp 97.0°F | Ht 70.0 in | Wt 257.0 lb

## 2022-07-02 DIAGNOSIS — R928 Other abnormal and inconclusive findings on diagnostic imaging of breast: Secondary | ICD-10-CM | POA: Diagnosis not present

## 2022-07-02 DIAGNOSIS — M940 Chondrocostal junction syndrome [Tietze]: Secondary | ICD-10-CM | POA: Diagnosis not present

## 2022-07-02 MED ORDER — CYCLOBENZAPRINE HCL 10 MG PO TABS: 10.0000 mg | ORAL_TABLET | Freq: Two times a day (BID) | ORAL | 0 refills | Status: DC | PRN

## 2022-07-02 MED ORDER — PREDNISONE 20 MG PO TABS: ORAL_TABLET | ORAL | 0 refills | Status: AC

## 2022-07-02 NOTE — Progress Notes (Signed)
Acute Office Visit  Subjective:    Patient ID: Marie Cox, female    DOB: 1981/02/20, 41 y.o.   MRN: [id number]  Chief Complaint  Patient presents with   Pain    Right rib    HPI: Patient is in today for complaints of pain under both breasts that radiates around to her back - worse with bending and movement and with deep breathing - states she did move a grill 3 weeks ago and symptoms started after that Denies abdominal pain, nausea or vomiting  Of note when reviewing her chart I noted she was due for a mammogram --- she is actually due for routine screening but also a follow up left breast ultrasound (that was due 12/22) - she is willing to schedule  Past Medical History:  Diagnosis Date   Acute medial meniscal tear 10/02/2020   Agatston coronary artery calcium score less than 100 12/10/2019   Anxiety 10/04/2016   Atherosclerosis    Atypical chest pain 06/24/2019   Bilateral carpal tunnel syndrome 01/30/2021   Bursitis of shoulder, left 04/19/2021   Cellulitis 05/22/2021   Coronary artery calcification 12/10/2019   DDD (degenerative disc disease), lumbar 09/03/2016   Encounter for screening mammogram for malignant neoplasm of breast 08/24/2020   Essential hypertension 07/18/2016   Ex-smoker 12/10/2019   Frequent headaches 08/24/2020   Gastroesophageal reflux disease without esophagitis 07/18/2016   Hand numbness 01/30/2021   Incidental lung nodule 08/21/2021   Intractable migraine with aura without status migrainosus 07/18/2016   Menorrhagia with regular cycle 07/18/2016   Migraines    Mixed dyslipidemia 12/10/2019   Pulmonary nodules/lesions, multiple 11/23/2019    Past Surgical History:  Procedure Laterality Date   CARPAL TUNNEL RELEASE Right 07/02/2021   CARPAL TUNNEL RELEASE  12/14/2021   CHOLECYSTECTOMY  2005   TUBAL LIGATION  2006    Family History  Problem Relation Age of Onset   Diabetes Mother    Hypertension Mother    Heart attack Maternal Grandfather     Heart Problems Father    Heart attack Paternal Grandmother    Lung cancer Paternal Grandfather     Social History   Socioeconomic History   Marital status: Soil scientist    Spouse name: Not on file   Number of children: 1   Years of education: Not on file   Highest education level: Not on file  Occupational History   Occupation: Psychiatrist  Tobacco Use   Smoking status: Former    Types: Cigarettes    Start date: 04/09/2019   Smokeless tobacco: Never  Vaping Use   Vaping Use: Never used  Substance and Sexual Activity   Alcohol use: Never   Drug use: Never   Sexual activity: Not Currently    Partners: Male  Other Topics Concern   Not on file  Social History Narrative   Not on file   Social Determinants of Health   Financial Resource Strain: Not on file  Food Insecurity: Not on file  Transportation Needs: Not on file  Physical Activity: Not on file  Stress: Not on file  Social Connections: Not on file  Intimate Partner Violence: Not on file    Outpatient Medications Prior to Visit  Medication Sig Dispense Refill   aspirin EC 81 MG tablet Take 81 mg by mouth daily.     atorvastatin (LIPITOR) 10 MG tablet Take 1 tablet (10 mg total) by mouth daily. 90 tablet 2   cetirizine (ZYRTEC) 10 MG tablet TAKE  1 TABLET BY MOUTH ONCE DAILY. 30 tablet 11   diclofenac Sodium (VOLTAREN) 1 % GEL Apply 1 application topically daily.     lisinopril (ZESTRIL) 10 MG tablet TAKE 1 TABLET BY MOUTH ONCE DAILY. 90 tablet 3   meloxicam (MOBIC) 15 MG tablet Take 15 mg by mouth daily.     Multiple Vitamins-Minerals (ZINC PO) Take 1 capsule by mouth daily.     omeprazole (PRILOSEC) 40 MG capsule TAKE 1 CAPSULE BY MOUTH ONCE DAILY. 30 capsule 3   VITAMIN D PO Take 1 capsule by mouth daily.     No facility-administered medications prior to visit.    Allergies  Allergen Reactions   Clindamycin Hives   Morphine And Related     Review of Systems CONSTITUTIONAL: Negative for chills,  fatigue, fever, unintentional weight gain and unintentional weight loss.  E/N/T: Negative for ear pain, nasal congestion and sore throat.  CARDIOVASCULAR: Negative for chest pain, dizziness, palpitations and pedal edema.  RESPIRATORY: Negative for recent cough and dyspnea.  GASTROINTESTINAL: Negative for abdominal pain, acid reflux symptoms, constipation, diarrhea, nausea and vomiting.  MSK: see HPI INTEGUMENTARY: Negative for rash.       Objective:    PHYSICAL EXAM:   VS: BP 122/84 (BP Location: Left Arm, Patient Position: Sitting, Cuff Size: Large)   Pulse 73   Temp (!) 97 F (36.1 C) (Temporal)   Ht _0  (1.778 m)   Wt 257 lb (116.6 kg)   SpO2 93%   BMI 36.88 kg/m   GEN: Well nourished, well developed, in no acute distress   Cardiac: RRR; no murmurs, rubs, or gallops,no edema - Respiratory:  normal respiratory rate and pattern with no distress - normal breath sounds with no rales, rhonchi, wheezes or rubs GI: normal bowel sounds, no masses or tenderness MS: tender to bilateral chest wall anteriorly and posteriorly Skin: warm and dry, no rash      Health Maintenance Due  Topic Date Due   MAMMOGRAM  05/14/2022    There are no preventive care reminders to display for this patient.   Lab Results  Component Value Date   TSH 2.310 11/28/2021   Lab Results  Component Value Date   WBC 6.1 11/28/2021   HGB 13.7 11/28/2021   HCT 41.7 11/28/2021   MCV 87 11/28/2021   PLT 310 11/28/2021   Lab Results  Component Value Date   NA 139 11/28/2021   K 4.4 11/28/2021   CO2 20 11/28/2021   GLUCOSE 95 11/28/2021   BUN 15 11/28/2021   CREATININE 0.90 11/28/2021   BILITOT 0.8 11/28/2021   ALKPHOS 85 11/28/2021   AST 14 11/28/2021   ALT 17 11/28/2021   PROT 6.5 11/28/2021   ALBUMIN 3.9 11/28/2021   CALCIUM 9.1 11/28/2021   EGFR 82 11/28/2021   Lab Results  Component Value Date   CHOL 151 11/28/2021   Lab Results  Component Value Date   HDL 60 11/28/2021    Lab Results  Component Value Date   LDLCALC 71 11/28/2021   Lab Results  Component Value Date   TRIG 110 11/28/2021   Lab Results  Component Value Date   CHOLHDL 2.5 11/28/2021   Lab Results  Component Value Date   HGBA1C 5.8 (H) 11/28/2021       Assessment & Plan:   Problem List Items Addressed This Visit   None Visit Diagnoses     Costochondritis    -  Primary   Relevant Medications  predniSONE (DELTASONE) 20 MG tablet   cyclobenzaprine (FLEXERIL) 10 MG tablet   Abnormal mammogram       Relevant Orders   MM Digital Diagnostic Bilat   US BREAST COMPLETE UNI LEFT INC AXILLA      Meds ordered this encounter  Medications   predniSONE (DELTASONE) 20 MG tablet    Sig: Take 3 tablets (60 mg total) by mouth daily with breakfast for 3 days, THEN 2 tablets (40 mg total) daily with breakfast for 3 days, THEN 1 tablet (20 mg total) daily with breakfast for 3 days.    Dispense:  18 tablet    Refill:  0    Order Specific Question:   Supervising Provider    Answer:   Shelton Silvas   cyclobenzaprine (FLEXERIL) 10 MG tablet    Sig: Take 1 tablet (10 mg total) by mouth 2 (two) times daily as needed for muscle spasms.    Dispense:  30 tablet    Refill:  0    Order Specific Question:   Supervising Provider    Answer:   Shelton Silvas    Orders Placed This Encounter  Procedures   MM Digital Diagnostic Bilat   US BREAST COMPLETE UNI LEFT INC AXILLA     Follow-up: Return if symptoms worsen or fail to improve.  An After Visit Summary was printed and given to the patient.  Yetta Flock [facility] [phone]

## 2022-07-17 ENCOUNTER — Encounter: Payer: Self-pay | Admitting: Physician Assistant

## 2022-07-17 ENCOUNTER — Ambulatory Visit (INDEPENDENT_AMBULATORY_CARE_PROVIDER_SITE_OTHER): Payer: 59 | Admitting: Physician Assistant

## 2022-07-17 VITALS — BP 120/86 | HR 87 | Temp 97.2°F | Ht 70.0 in | Wt 266.4 lb

## 2022-07-17 DIAGNOSIS — M940 Chondrocostal junction syndrome [Tietze]: Secondary | ICD-10-CM | POA: Diagnosis not present

## 2022-07-17 DIAGNOSIS — K219 Gastro-esophageal reflux disease without esophagitis: Secondary | ICD-10-CM | POA: Diagnosis not present

## 2022-07-17 NOTE — Progress Notes (Signed)
Acute Office Visit  Subjective:    Patient ID: Marie Cox, female    DOB: 12/12/1981, 41 y.o.   MRN: [id number]  Chief Complaint  Patient presents with   left side pain    HPI: Patient is in today for follow up - she states overall she is feeling better as  far as pleurisy symptoms - the prednisone and flexeril she was recently treated with improved most of her pain - right side has resolved but she is still having pain on left side - worse to palpation and with deep breathing Of note she is getting ultrasound of left breast in few weeks  She also gives history of having reflux and has noted more midepigastric to left upper stomach discomfort that has been going on for a few months.  Worse with eating .  No nausea or vomiting.  Does take omeprazole but having some breakthrough symptoms  Past Medical History:  Diagnosis Date   Acute medial meniscal tear 10/02/2020   Agatston coronary artery calcium score less than 100 12/10/2019   Anxiety 10/04/2016   Atherosclerosis    Atypical chest pain 06/24/2019   Bilateral carpal tunnel syndrome 01/30/2021   Bursitis of shoulder, left 04/19/2021   Cellulitis 05/22/2021   Coronary artery calcification 12/10/2019   DDD (degenerative disc disease), lumbar 09/03/2016   Encounter for screening mammogram for malignant neoplasm of breast 08/24/2020   Essential hypertension 07/18/2016   Ex-smoker 12/10/2019   Frequent headaches 08/24/2020   Gastroesophageal reflux disease without esophagitis 07/18/2016   Hand numbness 01/30/2021   Incidental lung nodule 08/21/2021   Intractable migraine with aura without status migrainosus 07/18/2016   Menorrhagia with regular cycle 07/18/2016   Migraines    Mixed dyslipidemia 12/10/2019   Pulmonary nodules/lesions, multiple 11/23/2019    Past Surgical History:  Procedure Laterality Date   CARPAL TUNNEL RELEASE Right 07/02/2021   CARPAL TUNNEL RELEASE  12/14/2021   CHOLECYSTECTOMY  2005   TUBAL LIGATION  2006     Family History  Problem Relation Age of Onset   Diabetes Mother    Hypertension Mother    Heart attack Maternal Grandfather    Heart Problems Father    Heart attack Paternal Grandmother    Lung cancer Paternal Grandfather     Social History   Socioeconomic History   Marital status: Soil scientist    Spouse name: Not on file   Number of children: 1   Years of education: Not on file   Highest education level: Not on file  Occupational History   Occupation: Psychiatrist  Tobacco Use   Smoking status: Former    Types: Cigarettes    Start date: 04/09/2019   Smokeless tobacco: Never  Vaping Use   Vaping Use: Never used  Substance and Sexual Activity   Alcohol use: Never   Drug use: Never   Sexual activity: Not Currently    Partners: Male  Other Topics Concern   Not on file  Social History Narrative   Not on file   Social Determinants of Health   Financial Resource Strain: Not on file  Food Insecurity: Not on file  Transportation Needs: Not on file  Physical Activity: Not on file  Stress: Not on file  Social Connections: Not on file  Intimate Partner Violence: Not on file    Outpatient Medications Prior to Visit  Medication Sig Dispense Refill   aspirin EC 81 MG tablet Take 81 mg by mouth daily.     atorvastatin (  LIPITOR) 10 MG tablet Take 1 tablet (10 mg total) by mouth daily. 90 tablet 2   cetirizine (ZYRTEC) 10 MG tablet TAKE 1 TABLET BY MOUTH ONCE DAILY. 30 tablet 11   diclofenac Sodium (VOLTAREN) 1 % GEL Apply 1 application topically daily.     lisinopril (ZESTRIL) 10 MG tablet TAKE 1 TABLET BY MOUTH ONCE DAILY. 90 tablet 3   meloxicam (MOBIC) 15 MG tablet Take 15 mg by mouth daily.     Multiple Vitamins-Minerals (ZINC PO) Take 1 capsule by mouth daily.     omeprazole (PRILOSEC) 40 MG capsule TAKE 1 CAPSULE BY MOUTH ONCE DAILY. 30 capsule 3   VITAMIN D PO Take 1 capsule by mouth daily.     cyclobenzaprine (FLEXERIL) 10 MG tablet Take 1 tablet (10 mg  total) by mouth 2 (two) times daily as needed for muscle spasms. 30 tablet 0   No facility-administered medications prior to visit.    Allergies  Allergen Reactions   Clindamycin Hives   Morphine And Related     Review of Systems CONSTITUTIONAL: Negative for chills, fatigue, fever, unintentional weight gain and unintentional weight loss.   CARDIOVASCULAR: Negative for chest pain, dizziness, palpitations and pedal edema.  RESPIRATORY: Negative for recent cough and dyspnea.  GASTROINTESTINALsee HPI MSK: see HPI INTEGUMENTARY: Negative for rash.       Objective:   PHYSICAL EXAM:   VS: BP 120/86 (BP Location: Left Arm, Patient Position: Sitting, Cuff Size: Large)   Pulse 87   Temp (!) 97.2 F (36.2 C) (Temporal)   Ht 5' 10"  (1.778 m)   Wt 266 lb 6.4 oz (120.8 kg)   SpO2 95%   BMI 38.22 kg/m   GEN: Well nourished, well developed, in no acute distress   Cardiac: RRR; no murmurs, rubs,  Respiratory:  normal respiratory rate and pattern with no distress - normal breath sounds with no rales, rhonchi, wheezes or rubs GI: normal bowel sounds, no masses - has tenderness to LUQ  MS: no deformity or atrophy - tenderness to left lower chest wall under left breast Skin: warm and dry, no rash     Health Maintenance Due  Topic Date Due   PAP SMEAR-Modifier  Never done   MAMMOGRAM  05/14/2022    There are no preventive care reminders to display for this patient.   Lab Results  Component Value Date   TSH 2.310 11/28/2021   Lab Results  Component Value Date   WBC 6.1 11/28/2021   HGB 13.7 11/28/2021   HCT 41.7 11/28/2021   MCV 87 11/28/2021   PLT 310 11/28/2021   Lab Results  Component Value Date   NA 139 11/28/2021   K 4.4 11/28/2021   CO2 20 11/28/2021   GLUCOSE 95 11/28/2021   BUN 15 11/28/2021   CREATININE 0.90 11/28/2021   BILITOT 0.8 11/28/2021   ALKPHOS 85 11/28/2021   AST 14 11/28/2021   ALT 17 11/28/2021   PROT 6.5 11/28/2021   ALBUMIN 3.9 11/28/2021    CALCIUM 9.1 11/28/2021   EGFR 82 11/28/2021   Lab Results  Component Value Date   CHOL 151 11/28/2021   Lab Results  Component Value Date   HDL 60 11/28/2021   Lab Results  Component Value Date   LDLCALC 71 11/28/2021   Lab Results  Component Value Date   TRIG 110 11/28/2021   Lab Results  Component Value Date   CHOLHDL 2.5 11/28/2021   Lab Results  Component Value Date  HGBA1C 5.8 (H) 11/28/2021       Assessment & Plan:   Problem List Items Addressed This Visit       Digestive   Gastroesophageal reflux disease without esophagitis   Relevant Orders   Comprehensive metabolic panel   CBC with Differential/Platelet   Helicobacter pylori abs-IgG+IgA, bld If negative will change from omeprazole to protonix   Other Visit Diagnoses     Costochondritis    -  Primary Continue flexeril as needed and meloxicam      No orders of the defined types were placed in this encounter.   Orders Placed This Encounter  Procedures   DG Chest 2 View   Comprehensive metabolic panel   CBC with Differential/Platelet   Helicobacter pylori abs-IgG+IgA, bld     Follow-up: Return if symptoms worsen or fail to improve.  An After Visit Summary was printed and given to the patient.  Yetta Flock [facility] [phone]

## 2022-07-18 ENCOUNTER — Other Ambulatory Visit: Payer: Self-pay

## 2022-07-18 ENCOUNTER — Telehealth: Payer: Self-pay

## 2022-07-18 DIAGNOSIS — M940 Chondrocostal junction syndrome [Tietze]: Secondary | ICD-10-CM

## 2022-07-18 NOTE — Telephone Encounter (Signed)
Called patient, made her aware of chest xray results. Results are as follows: Notify no acute cardiopulmonary disease, no abnormalities.   Patient is aware and VU.   Lorita Officer, CCMA 07/18/22 1:19 PM

## 2022-07-24 ENCOUNTER — Other Ambulatory Visit: Payer: Self-pay | Admitting: Physician Assistant

## 2022-07-24 LAB — CBC WITH DIFFERENTIAL/PLATELET
Basophils Absolute: 0 10*3/uL (ref 0.0–0.2)
Basos: 1 %
EOS (ABSOLUTE): 0.3 10*3/uL (ref 0.0–0.4)
Eos: 5 %
Hematocrit: 39.7 % (ref 34.0–46.6)
Hemoglobin: 13.1 g/dL (ref 11.1–15.9)
Immature Grans (Abs): 0 10*3/uL (ref 0.0–0.1)
Immature Granulocytes: 1 %
Lymphocytes Absolute: 0.9 10*3/uL (ref 0.7–3.1)
Lymphs: 14 %
MCH: 28.5 pg (ref 26.6–33.0)
MCHC: 33 g/dL (ref 31.5–35.7)
MCV: 86 fL (ref 79–97)
Monocytes Absolute: 0.5 10*3/uL (ref 0.1–0.9)
Monocytes: 9 %
Neutrophils Absolute: 4.4 10*3/uL (ref 1.4–7.0)
Neutrophils: 70 %
Platelets: 308 10*3/uL (ref 150–450)
RBC: 4.6 x10E6/uL (ref 3.77–5.28)
RDW: 14.7 % (ref 11.7–15.4)
WBC: 6.2 10*3/uL (ref 3.4–10.8)

## 2022-07-24 LAB — COMPREHENSIVE METABOLIC PANEL
ALT: 22 IU/L (ref 0–32)
AST: 18 IU/L (ref 0–40)
Albumin/Globulin Ratio: 1.7 (ref 1.2–2.2)
Albumin: 4.1 g/dL (ref 3.9–4.9)
Alkaline Phosphatase: 91 IU/L (ref 44–121)
BUN/Creatinine Ratio: 19 (ref 9–23)
BUN: 19 mg/dL (ref 6–24)
Bilirubin Total: 0.6 mg/dL (ref 0.0–1.2)
CO2: 21 mmol/L (ref 20–29)
Calcium: 8.8 mg/dL (ref 8.7–10.2)
Chloride: 105 mmol/L (ref 96–106)
Creatinine, Ser: 0.98 mg/dL (ref 0.57–1.00)
Globulin, Total: 2.4 g/dL (ref 1.5–4.5)
Glucose: 89 mg/dL (ref 70–99)
Potassium: 4.4 mmol/L (ref 3.5–5.2)
Sodium: 141 mmol/L (ref 134–144)
Total Protein: 6.5 g/dL (ref 6.0–8.5)
eGFR: 73 mL/min/{1.73_m2} (ref >59)

## 2022-07-24 LAB — HELICOBACTER PYLORI ABS-IGG+IGA, BLD
H. pylori, IgA Abs: <9 units (ref 0.0–8.9)
H. pylori, IgG AbS: 0.46 Index Value (ref 0.00–0.79)

## 2022-07-24 MED ORDER — PANTOPRAZOLE SODIUM 40 MG PO TBEC: 40.0000 mg | DELAYED_RELEASE_TABLET | Freq: Every day | ORAL | 3 refills | Status: DC

## 2022-08-08 ENCOUNTER — Other Ambulatory Visit: Payer: Self-pay | Admitting: Legal Medicine

## 2022-08-08 DIAGNOSIS — E782 Mixed hyperlipidemia: Secondary | ICD-10-CM

## 2022-08-11 ENCOUNTER — Other Ambulatory Visit: Payer: Self-pay | Admitting: Legal Medicine

## 2022-08-21 ENCOUNTER — Telehealth: Payer: Self-pay

## 2022-08-21 ENCOUNTER — Other Ambulatory Visit: Payer: Self-pay | Admitting: Physician Assistant

## 2022-08-21 ENCOUNTER — Ambulatory Visit: Payer: 59 | Admitting: Physician Assistant

## 2022-08-21 DIAGNOSIS — R1013 Epigastric pain: Secondary | ICD-10-CM

## 2022-08-21 DIAGNOSIS — K219 Gastro-esophageal reflux disease without esophagitis: Secondary | ICD-10-CM

## 2022-08-21 NOTE — Telephone Encounter (Signed)
Patient missed appointment today and due to [place] not having any opening, she was not able to see provider. Patient stated she was still having the on going upper stomach pain and would like to be referral to Dr. Chales Abrahams. Stated that pain stays there is not worst than before.   I explain that is her pain gets worst, she can go to nearest ED or urgent care or call office to see a provider. Verbalized understanding.

## 2022-08-21 NOTE — Telephone Encounter (Signed)
Will make referral. 

## 2022-08-28 ENCOUNTER — Encounter: Payer: Self-pay | Admitting: Nurse Practitioner

## 2022-08-28 ENCOUNTER — Ambulatory Visit: Payer: Commercial Managed Care - HMO | Admitting: Nurse Practitioner

## 2022-08-28 VITALS — BP 123/76 | HR 94 | Ht 70.0 in | Wt 254.0 lb

## 2022-08-28 DIAGNOSIS — R1012 Left upper quadrant pain: Secondary | ICD-10-CM | POA: Diagnosis not present

## 2022-08-28 DIAGNOSIS — K5909 Other constipation: Secondary | ICD-10-CM

## 2022-08-28 DIAGNOSIS — K219 Gastro-esophageal reflux disease without esophagitis: Secondary | ICD-10-CM | POA: Diagnosis not present

## 2022-08-28 NOTE — Addendum Note (Signed)
Addended by: Arville Care on: 08/28/2022 09:42 AM   Modules accepted: Orders

## 2022-08-28 NOTE — Patient Instructions (Addendum)
If you are age 43 or younger, your body mass index should be between 19-25. Your Body mass index is 36.45 kg/m. If this is out of the aformentioned range listed, please consider follow up with your Primary Care Provider.  ________________________________________________________  The [facility] providers would like to encourage you to use [facility] to communicate with providers for non-urgent requests or questions.  Due to long hold times on the telephone, sending your provider a message by [facility] may be a faster and more efficient way to get a response.  Please allow 48 business hours for a response.  Please remember that this is for non-urgent requests.  _______________________________________________________  Bonita Quin have been scheduled for an endoscopy. Please follow written instructions given to you at your visit today. If you use inhalers (even only as needed), please bring them with you on the day of your procedure.  Acid Reflux  Below are some measures you can take to possibly improve acid reflux symptoms . We may have discussed some of these today in the office. Not everything on this list may apply to you   --If you are taking anti-reflux ( GERD) medication be sure to take it 30 minutes before breakfast and if taking twice daily then also second dose should be 30 minutes before dinner.   --Avoid late meals / bedtime snacks.   --Avoid trigger foods ( foods which you know tend to aggravate you reflux symptoms). Some common trigger foods include spicy foods, fatty foods, acidic foods, chocolate and caffeine.  --Elevate the head of bed 6-8 inches on blocks or bricks. If not able to elevate the head of the bed consider purchasing a wedge pillow to sleep on.    --Weight reduction / maintain a healthy BMI ( body mass index) may be help with reflux symptoms  --Sometimes with the above mentioned "lifestyle changes" patients are able to reduce the amount of GERD medications they take. Our goal is to have  you on the lowest effective dose of medication   Constipation Start daily Benefiber, increase water intake to 60 oz / day. Add 2 stools softeners at bedtime. Take Miralax QOD, can increase to daily if needed   Follow up pending the results of your Endoscopy or as needed.  Thank you for entrusting me with your care and choosing [facility].  Willette Cluster, NP-C

## 2022-08-28 NOTE — Progress Notes (Signed)
Chief Complaint: Nausea, left upper abdominal pain, early satiety, GERD, chronic constipation   Assessment & Plan   #41 year old female with chronic (1 year) postprandial LUQ pain, nausea and early satiety without associated weight loss.  She has been taking Mobic for a year also takes ibuprofen sometimes.  Rule out PUD.  She reports occasional black stool, takes Kaopectacte. Normal hgb in late July.  She is really unable to hold Mobic due to arthritis.  Continue daily PPI Schedule for EGD to evaluate the LUQ pain. The risks and benefits of EGD with possible biopsies were discussed with the patient who agrees to proceed.  Patient is from [facility], she is requesting to be under the care of Dr. Chales Abrahams  # Chronic GERD with heartburn and regurgitation.  Symptoms refractory to daily PPI Discussed anti-reflux measures such as avoidance of late meals / bedtime snacks, HOB elevation (or use of wedge pillow), weight reduction ( if applicable)  / maintaining a healthy BMI ( body mass index),  and avoidance of trigger foods and caffeine.  Continue PPI every morning    # Chronic constipation with infrequent, hard stools Start daily fiber  Increase water intake to 60 oz / day .Add 2 stools softeners at bedtime.  Take Miralax QOD, can increase to daily if needed  HPI:    Marie Cox is a 41 y.o. year old female with a  past medical history of dyslipidemia, costochondritis, GERD, anxiety, hypertension, headaches, pulmonary nodules, cholecystectomy in 2005.   See PMH / PSH for additional history  Marie Cox is new to the practice, referred by her PCP for GERD/epigastric pain. She has been having upper abdominal pain for about one year. Her LUQ "swells up" after eating and leads to nausea. She eats half the portion that she used to .  Other than that the pain is always there. She hasn't lost weight. She has been on Mobic for a least a year. Also take Ibuprofen about one a week.  No blood in stool but has had some black stool about 2-3 times a month over the last year. Takes Kaopectate,  Brinkley gives a history of chronic acid reflux with heartburn and regurgitation. PCP recently changed her to Pantoprazole. So far, no significant improvement.   She has chronic intermittent constipation describes as infrequent , hard stool. She takes the Miralax as needed. She hasn't tried to take it every day.   CMP , CBC on  07/17/2022 were normal.    Previous Labs / Imaging::    Latest Ref Rng & Units 07/17/2022    3:02 PM 11/28/2021   10:26 AM  CBC  WBC 3.4 - 10.8 x10E3/uL 6.2  6.1   Hemoglobin 11.1 - 15.9 g/dL 40.9  73.5   Hematocrit 34.0 - 46.6 % 39.7  41.7   Platelets 150 - 450 x10E3/uL 308  310     No results found for: "LIPASE"    Latest Ref Rng & Units 07/17/2022    3:02 PM 11/28/2021   10:26 AM 06/08/2020    9:41 AM  CMP  Glucose 70 - 99 mg/dL 89  95  329   BUN 6 - 24 mg/dL 19  15  12    Creatinine 0.57 - 1.00 mg/dL  9.24  2.68   Sodium 134 - 144 mmol/L 141  139  139   Potassium 3.5 - 5.2 mmol/L 4.4  4.4  4.5   Chloride 96 - 106 mmol/L 105  106  102  CO2 20 - 29 mmol/L 21  20  22    Calcium 8.7 - 10.2 mg/dL 8.8  9.1  9.6   Total Protein 6.0 - 8.5 g/dL 6.5  6.5  7.0   Total Bilirubin 0.0 - 1.2 mg/dL 0.6  0.8  1.4   Alkaline Phos 44 - 121 IU/L 91  85  88   AST 0 - 40 IU/L 18  14  17    ALT 0 - 32 IU/L 22  17  18      Past Medical History:  Diagnosis Date   Acute medial meniscal tear 10/02/2020   Agatston coronary artery calcium score less than 100 12/10/2019   Anxiety 10/04/2016   Atherosclerosis    Atypical chest pain 06/24/2019   Bilateral carpal tunnel syndrome 01/30/2021   Bursitis of shoulder, left 04/19/2021   Cellulitis 05/22/2021   Coronary artery calcification 12/10/2019   DDD (degenerative disc disease), lumbar 09/03/2016   Encounter for screening mammogram for malignant neoplasm of breast 08/24/2020   Essential hypertension 07/18/2016    Ex-smoker 12/10/2019   Frequent headaches 08/24/2020   Gastroesophageal reflux disease without esophagitis 07/18/2016   Hand numbness 01/30/2021   Incidental lung nodule 08/21/2021   Intractable migraine with aura without status migrainosus 07/18/2016   Menorrhagia with regular cycle 07/18/2016   Migraines    Mixed dyslipidemia 12/10/2019   Pulmonary nodules/lesions, multiple 11/23/2019   Past Surgical History:  Procedure Laterality Date   CARPAL TUNNEL RELEASE Right 07/02/2021   CARPAL TUNNEL RELEASE  12/14/2021   CHOLECYSTECTOMY  2005   TUBAL LIGATION  2006   Family History  Problem Relation Age of Onset   Diabetes Mother    Hypertension Mother    Heart attack Maternal Grandfather    Heart Problems Father    Heart attack Paternal Grandmother    Lung cancer Paternal Grandfather    Social History   Tobacco Use   Smoking status: Former    Types: Cigarettes    Start date: 04/09/2019   Smokeless tobacco: Never  Vaping Use   Vaping Use: Never used  Substance Use Topics   Alcohol use: Never   Drug use: Never   Current Outpatient Medications  Medication Sig Dispense Refill   aspirin EC 81 MG tablet Take 81 mg by mouth daily.     atorvastatin (LIPITOR) 10 MG tablet TAKE ONE (1) TABLET BY MOUTH EVERY DAY 90 tablet 2   cetirizine (ZYRTEC) 10 MG tablet TAKE 1 TABLET BY MOUTH ONCE DAILY. 30 tablet 11   diclofenac Sodium (VOLTAREN) 1 % GEL Apply 1 application topically daily.     lisinopril (ZESTRIL) 10 MG tablet TAKE 1 TABLET BY MOUTH ONCE DAILY. 90 tablet 3   meloxicam (MOBIC) 15 MG tablet Take 15 mg by mouth daily.     Multiple Vitamins-Minerals (ZINC PO) Take 1 capsule by mouth daily.     pantoprazole (PROTONIX) 40 MG tablet Take 1 tablet (40 mg total) by mouth daily. 30 tablet 3   VITAMIN D PO Take 1 capsule by mouth daily.     No current facility-administered medications for this visit.   Allergies  Allergen Reactions   Clindamycin Hives   Morphine And Related      Review  of Systems: Positive for allergy, sinus trouble, anxiety, arthritis, back pain, fatigue, night sweats.  All other systems reviewed and negative except where noted in HPI.   Wt Readings from Last 3 Encounters:  08/28/22 254 lb (115.2 kg)  07/17/22 266 lb 6.4 oz (  120.8 kg)  07/02/22 257 lb (116.6 kg)    Physical Exam   Ht 5\' 10"  (1.778 m)   Wt 254 lb (115.2 kg)   BMI 36.45 kg/m  Constitutional: Pleasant, obese female in no acute distress. Psychiatric: Pleasant. Normal mood and affect. Behavior is normal. EENT: Pupils normal.  Conjunctivae are normal. No scleral icterus. Neck supple.  Cardiovascular: Normal rate, regular rhythm. No edema Pulmonary/chest: Effort normal and breath sounds normal. No wheezing, rales or rhonchi. Abdominal: Soft, nondistended, nontender. Bowel sounds active throughout. There are no masses palpable. No hepatomegaly. Neurological: Alert and oriented to person place and time. Skin: Skin is warm and dry. No rashes noted.  , NP  08/28/2022, 8:31 AM  Cc:  Referring Provider 10/28/2022, PA-C

## 2022-09-18 NOTE — Progress Notes (Signed)
Agree with assessment/plan.  Raj Falicity Sheets, MD [facility] [phone]  

## 2022-10-07 ENCOUNTER — Encounter: Payer: Commercial Managed Care - HMO | Admitting: Gastroenterology

## 2022-10-08 ENCOUNTER — Telehealth: Payer: Self-pay | Admitting: Physician Assistant

## 2022-10-08 NOTE — Telephone Encounter (Signed)
ERROR on Marie Cox's message. Patient called yesterday evening on 10/07/2022 at 3:41 PM.  Patient called this morning on 10/08/2022 around 8:35 AM stating that she remembers how she hurt her back. She was leaving work and stepped off the sidewalk wrong and hurt her back. I spoke with damon who stated that we can schedule the appointment however if the patient gets her and states the injury occurred while at work, then the appointment will be canceled and she will need to contact her HR about worker comp.   With the information of what the patient stated to me this morning, I offered her an appointment with Gay Filler at her next opening for this afternoon at 3:40. Patient stated that she is unable to do that she will have to go to [facility] due to having to be at work by 3.

## 2022-10-08 NOTE — Telephone Encounter (Signed)
Pt called in 10-08-22 at 3:41pm requesting a to schedule appt due to hurting her back at work. She stated she has been working with the Landscape architect. I told her since her accident happen at work she would have to seek out HR due to work comp related matters. She stated she wouldn't do workers comp she wants to use her own insurance. Office poilcy per office manager that it happened at work which we still have to process/treat it like workers comp which [employer] may rejected her appt and that leave her with a high amount bill.

## 2022-10-14 ENCOUNTER — Encounter: Payer: Commercial Managed Care - HMO | Admitting: Gastroenterology

## 2022-10-17 ENCOUNTER — Encounter: Payer: Self-pay | Admitting: Gastroenterology

## 2022-10-17 ENCOUNTER — Ambulatory Visit (AMBULATORY_SURGERY_CENTER): Payer: Commercial Managed Care - HMO | Admitting: Gastroenterology

## 2022-10-17 VITALS — BP 122/66 | HR 72 | Temp 98.9°F | Resp 12 | Ht 70.0 in | Wt 254.0 lb

## 2022-10-17 DIAGNOSIS — R1012 Left upper quadrant pain: Secondary | ICD-10-CM

## 2022-10-17 DIAGNOSIS — K449 Diaphragmatic hernia without obstruction or gangrene: Secondary | ICD-10-CM | POA: Diagnosis not present

## 2022-10-17 DIAGNOSIS — K219 Gastro-esophageal reflux disease without esophagitis: Secondary | ICD-10-CM

## 2022-10-17 MED ORDER — SODIUM CHLORIDE 0.9 % IV SOLN: 500.0000 mL | Freq: Once | INTRAVENOUS | Status: DC

## 2022-10-17 NOTE — Progress Notes (Signed)
Chief Complaint: Nausea, left upper abdominal pain, early satiety, GERD, chronic constipation     Assessment & Plan    #41 year old female with chronic (1 year) postprandial LUQ pain, nausea and early satiety without associated weight loss.  She has been taking Mobic for a year also takes ibuprofen sometimes.  Rule out PUD.  She reports occasional black stool, takes Kaopectacte. Normal hgb in late July.  She is really unable to hold Mobic due to arthritis.  Continue daily PPI Schedule for EGD to evaluate the LUQ pain. The risks and benefits of EGD with possible biopsies were discussed with the patient who agrees to proceed.  Patient is from [facility], she is requesting to be under the care of Dr. Chales Abrahams   # Chronic GERD with heartburn and regurgitation.  Symptoms refractory to daily PPI Discussed anti-reflux measures such as avoidance of late meals / bedtime snacks, HOB elevation (or use of wedge pillow), weight reduction ( if applicable)  / maintaining a healthy BMI ( body mass index),  and avoidance of trigger foods and caffeine.  Continue PPI every morning     # Chronic constipation with infrequent, hard stools Start daily fiber  Increase water intake to 60 oz / day .Add 2 stools softeners at bedtime.  Take Miralax QOD, can increase to daily if needed   HPI:     Marie Cox is a 41 y.o. year old female with a  past medical history of dyslipidemia, costochondritis, GERD, anxiety, hypertension, headaches, pulmonary nodules, cholecystectomy in 2005.   See PMH / PSH for additional history   Marie Cox is new to the practice, referred by her PCP for GERD/epigastric pain. She has been having upper abdominal pain for about one year. Her LUQ "swells up" after eating and leads to nausea. She eats half the portion that she used to .  Other than that the pain is always there. She hasn't lost weight. She has been on Mobic for a least a year. Also take Ibuprofen about one  a week. No blood in stool but has had some black stool about 2-3 times a month over the last year. Takes Kaopectate,   [name] gives a history of chronic acid reflux with heartburn and regurgitation. PCP recently changed her to Pantoprazole. So far, no significant improvement.    She has chronic intermittent constipation describes as infrequent , hard stool. She takes the Miralax as needed. She hasn't tried to take it every day.    CMP , CBC on  07/17/2022 were normal.      Previous Labs / Imaging::     Latest Ref Rng & Units 07/17/2022    3:02 PM 11/28/2021   10:26 AM  CBC  WBC 3.4 - 10.8 x10E3/uL 6.2  6.1   Hemoglobin 11.1 - 15.9 g/dL 35.3  61.4   Hematocrit 34.0 - 46.6 % 39.7  41.7   Platelets 150 - 450 x10E3/uL 308  310       Recent Labs  No results found for: "LIPASE"       Latest Ref Rng & Units 07/17/2022    3:02 PM 11/28/2021   10:26 AM 06/08/2020    9:41 AM  CMP  Glucose 70 - 99 mg/dL 89  95  431   BUN 6 - 24 mg/dL 19  15  12    Creatinine 0.57 - 1.00 mg/dL  5.40  0.86   Sodium 134 - 144 mmol/L 141  139  139  Potassium 3.5 - 5.2 mmol/L 4.4  4.4  4.5   Chloride 96 - 106 mmol/L 105  106  102   CO2 20 - 29 mmol/L 21  20  22    Calcium 8.7 - 10.2 mg/dL 8.8  9.1  9.6   Total Protein 6.0 - 8.5 g/dL 6.5  6.5  7.0   Total Bilirubin 0.0 - 1.2 mg/dL 0.6  0.8  1.4   Alkaline Phos 44 - 121 IU/L 91  85  88   AST 0 - 40 IU/L 18  14  17    ALT 0 - 32 IU/L 22  17  18            Past Medical History:  Diagnosis Date   Acute medial meniscal tear 10/02/2020   Agatston coronary artery calcium score less than 100 12/10/2019   Anxiety 10/04/2016   Atherosclerosis     Atypical chest pain 06/24/2019   Bilateral carpal tunnel syndrome 01/30/2021   Bursitis of shoulder, left 04/19/2021   Cellulitis 05/22/2021   Coronary artery calcification 12/10/2019   DDD (degenerative disc disease), lumbar 09/03/2016   Encounter for screening mammogram for malignant neoplasm of breast 08/24/2020    Essential hypertension 07/18/2016   Ex-smoker 12/10/2019   Frequent headaches 08/24/2020   Gastroesophageal reflux disease without esophagitis 07/18/2016   Hand numbness 01/30/2021   Incidental lung nodule 08/21/2021   Intractable migraine with aura without status migrainosus 07/18/2016   Menorrhagia with regular cycle 07/18/2016   Migraines     Mixed dyslipidemia 12/10/2019   Pulmonary nodules/lesions, multiple 11/23/2019         Past Surgical History:  Procedure Laterality Date   CARPAL TUNNEL RELEASE Right 07/02/2021   CARPAL TUNNEL RELEASE   12/14/2021   CHOLECYSTECTOMY   2005   TUBAL LIGATION   2006         Family History  Problem Relation Age of Onset   Diabetes Mother     Hypertension Mother     Heart attack Maternal Grandfather     Heart Problems Father     Heart attack Paternal Grandmother     Lung cancer Paternal Grandfather      Social History         Tobacco Use   Smoking status: Former      Types: Cigarettes      Start date: 04/09/2019   Smokeless tobacco: Never  Vaping Use   Vaping Use: Never used  Substance Use Topics   Alcohol use: Never   Drug use: Never          Current Outpatient Medications  Medication Sig Dispense Refill   aspirin EC 81 MG tablet Take 81 mg by mouth daily.       atorvastatin (LIPITOR) 10 MG tablet TAKE ONE (1) TABLET BY MOUTH EVERY DAY 90 tablet 2   cetirizine (ZYRTEC) 10 MG tablet TAKE 1 TABLET BY MOUTH ONCE DAILY. 30 tablet 11   diclofenac Sodium (VOLTAREN) 1 % GEL Apply 1 application topically daily.       lisinopril (ZESTRIL) 10 MG tablet TAKE 1 TABLET BY MOUTH ONCE DAILY. 90 tablet 3   meloxicam (MOBIC) 15 MG tablet Take 15 mg by mouth daily.       Multiple Vitamins-Minerals (ZINC PO) Take 1 capsule by mouth daily.       pantoprazole (PROTONIX) 40 MG tablet Take 1 tablet (40 mg total) by mouth daily. 30 tablet 3   VITAMIN D PO Take 1 capsule by mouth daily.  No current facility-administered medications for this visit.         Allergies  Allergen Reactions   Clindamycin Hives   Morphine And Related          Review of Systems: Positive for allergy, sinus trouble, anxiety, arthritis, back pain, fatigue, night sweats.  All other systems reviewed and negative except where noted in HPI.       Wt Readings from Last 3 Encounters:  08/28/22 254 lb (115.2 kg)  07/17/22 266 lb 6.4 oz (120.8 kg)  07/02/22 257 lb (116.6 kg)      Physical Exam    Ht 5\' 10"  (1.778 m)   Wt 254 lb (115.2 kg)   BMI 36.45 kg/m  Constitutional: Pleasant, obese female in no acute distress. Psychiatric: Pleasant. Normal mood and affect. Behavior is normal. EENT: Pupils normal.  Conjunctivae are normal. No scleral icterus. Neck supple.  Cardiovascular: Normal rate, regular rhythm. No edema Pulmonary/chest: Effort normal and breath sounds normal. No wheezing, rales or rhonchi. Abdominal: Soft, nondistended, nontender. Bowel sounds active throughout. There are no masses palpable. No hepatomegaly. Neurological: Alert and oriented to person place and time. Skin: Skin is warm and dry. No rashes noted.   Tye Savoy     Attending physician's note   I have taken history, reviewed the chart and examined the patient. I performed a substantive portion of this encounter, including complete performance of at least one of the key components, in conjunction with the APP. I agree with the Advanced Practitioner's note, impression and recommendations.   EGD for further eval Reviwed CT from [facility] 2021 If neg , further eval.   Carmell Austria, MD [facility] [phone]

## 2022-10-17 NOTE — Progress Notes (Signed)
Vss nad trans to recovery

## 2022-10-17 NOTE — Progress Notes (Signed)
Vss nad trans to pacu °

## 2022-10-17 NOTE — Op Note (Signed)
[facility] Patient Name: Marie Cox Procedure Date: 10/17/2022 9:31 AM MRN: [id number] Endoscopist: Jackquline Denmark , MD, [id number] Age: 41 Referring MD:  Date of Birth: 19-May-1981 Gender: Female Account #: [id number] Procedure:                Upper GI endoscopy Indications:              Abdominal pain in the left upper quadrant. GERD Medicines:                Monitored Anesthesia Care Procedure:                Pre-Anesthesia Assessment:                           - Prior to the procedure, a History and Physical                            was performed, and patient medications and                            allergies were reviewed. The patient's tolerance of                            previous anesthesia was also reviewed. The risks                            and benefits of the procedure and the sedation                            options and risks were discussed with the patient.                            All questions were answered, and informed consent                            was obtained. Prior Anticoagulants: The patient has                            taken no anticoagulant or antiplatelet agents. ASA                            Grade Assessment: II - A patient with mild systemic                            disease. After reviewing the risks and benefits,                            the patient was deemed in satisfactory condition to                            undergo the procedure.                           After obtaining informed consent, the endoscope was  passed under direct vision. Throughout the                            procedure, the patient's blood pressure, pulse, and                            oxygen saturations were monitored continuously. The                            GIF [id number] #[id number] was introduced through the                            mouth, and advanced to the second part of duodenum.                            The  upper GI endoscopy was accomplished without                            difficulty. The patient tolerated the procedure                            well. Scope In: Scope Out: Findings:                 The examined esophagus was normal. Biopsies were                            obtained from the proximal and distal esophagus                            with cold forceps for histology of suspected                            eosinophilic esophagitis.                           The Z-line was regular and was found 36 cm from the                            incisors.                           A 2 cm hiatal hernia was present.                           The gastroesophageal flap valve was visualized                            endoscopically and classified as Hill Grade III                            (minimal fold, loose to endoscope, hiatal hernia                            likely).  The exam of the stomach was otherwise normal.                            Multiple biopsies were obtained from the body of                            the stomach and antrum to rule out HP.                           The examined duodenum was normal. Biopsies for                            histology were taken with a cold forceps for                            evaluation of celiac disease. Complications:            No immediate complications. Estimated Blood Loss:     Estimated blood loss: none. Impression:               - Small hiatal hernia.                           - Otherwise normal EGD. Recommendation:           - Patient has a contact number available for                            emergencies. The signs and symptoms of potential                            delayed complications were discussed with the                            patient. Return to normal activities tomorrow.                            Written discharge instructions were provided to the                            patient.                            - Resume previous diet.                           - Continue present medications including Protonix                            40 mg p.o. daily.                           - Await pathology results.                           - Follow an antireflux regimen.                           -  The findings and recommendations were discussed                            with the patient's family.                           - If still with problems, then further WU by means                            of colon. She will let us know. Lynann Bologna, MD 10/17/2022 10:08:28 AM This report has been signed electronically.

## 2022-10-17 NOTE — Patient Instructions (Signed)
Read all of the handouts given to you by your recovery room nurse.  Continue your protonix and previous medications as ordered. Follow the the antireflux regimen.  YOU HAD AN ENDOSCOPIC PROCEDURE TODAY AT [facility]:   Refer to the procedure report that was given to you for any specific questions about what was found during the examination.  If the procedure report does not answer your questions, please call your gastroenterologist to clarify.  If you requested that your care partner not be given the details of your procedure findings, then the procedure report has been included in a sealed envelope for you to review at your convenience later.  YOU SHOULD EXPECT: Some feelings of bloating in the abdomen. Passage of more gas than usual.  Walking can help get rid of the air that was put into your GI tract during the procedure and reduce the bloating.   Please Note:  You might notice some irritation and congestion in your nose or some drainage.  This is from the oxygen used during your procedure.  There is no need for concern and it should clear up in a day or so.  SYMPTOMS TO REPORT IMMEDIATELY:   Following upper endoscopy (EGD)  Vomiting of blood or coffee ground material  New chest pain or pain under the shoulder blades  Painful or persistently difficult swallowing  New shortness of breath  Fever of 100F or higher  Black, tarry-looking stools  For urgent or emergent issues, a gastroenterologist can be reached at any hour by calling [phone]. Do not use MyChart messaging for urgent concerns.    DIET:  We do recommend a small meal at first, but then you may proceed to your regular diet.  Drink plenty of fluids but you should avoid alcoholic beverages for 24 hours.  ACTIVITY:  You should plan to take it easy for the rest of today and you should NOT DRIVE or use heavy machinery until tomorrow (because of the sedation medicines used during the test).    FOLLOW UP: Our  staff will call the number listed on your records the next business day following your procedure.  We will call around 7:15- 8:00 am to check on you and address any questions or concerns that you may have regarding the information given to you following your procedure. If we do not reach you, we will leave a message.     If any biopsies were taken you will be contacted by phone or by letter within the next 1-3 weeks.  Please call us at [phone] if you have not heard about the biopsies in 3 weeks.    SIGNATURES/CONFIDENTIALITY: You and/or your care partner have signed paperwork which will be entered into your electronic medical record.  These signatures attest to the fact that that the information above on your After Visit Summary has been reviewed and is understood.  Full responsibility of the confidentiality of this discharge information lies with you and/or your care-partner.

## 2022-10-17 NOTE — Progress Notes (Signed)
Called to room to assist during endoscopic procedure.  Patient ID and intended procedure confirmed with present staff. Received instructions for my participation in the procedure from the performing physician.  

## 2022-10-18 ENCOUNTER — Telehealth: Payer: Self-pay | Admitting: *Deleted

## 2022-10-18 NOTE — Telephone Encounter (Signed)
  Follow up Call-     10/17/2022    9:25 AM  Call back number  Post procedure Call Back phone  # [phone]  Permission to leave phone message Yes     Patient questions:  Message left to call if necessary.

## 2022-10-27 ENCOUNTER — Encounter: Payer: Self-pay | Admitting: Gastroenterology

## 2022-10-30 ENCOUNTER — Ambulatory Visit (INDEPENDENT_AMBULATORY_CARE_PROVIDER_SITE_OTHER): Payer: Commercial Managed Care - HMO | Admitting: Physician Assistant

## 2022-10-30 ENCOUNTER — Encounter: Payer: Self-pay | Admitting: Physician Assistant

## 2022-10-30 VITALS — BP 120/74 | HR 78 | Temp 97.8°F | Ht 70.0 in | Wt 258.0 lb

## 2022-10-30 DIAGNOSIS — M5442 Lumbago with sciatica, left side: Secondary | ICD-10-CM | POA: Diagnosis not present

## 2022-10-30 MED ORDER — PREDNISONE 20 MG PO TABS: ORAL_TABLET | ORAL | 0 refills | Status: AC

## 2022-10-30 MED ORDER — CYCLOBENZAPRINE HCL 5 MG PO TABS: 5.0000 mg | ORAL_TABLET | Freq: Every day | ORAL | 0 refills | Status: DC

## 2022-10-30 NOTE — Progress Notes (Signed)
Acute Office Visit  Subjective:    Patient ID: Marie Cox, female    DOB: 1981-07-28, 41 y.o.   MRN: [id number]  Chief Complaint  Patient presents with   Back Pain    HPI: Patient is in today for complaints of low back pain with pain radiating down left leg.  She states she was walking to her car and stepped off a curb wrong.  She fell down landing on her right hip.  She woke up the next morning having pain in her left lower back and with pain intermittently radiating down her left leg.  She states she is using meloxicam and voltaren gel prn.  She was seen at Urgent Care in [place] on 10/08/22 and given 6 days of prednisone which did improve her symptoms but all pain is coming back.  Xray done in that office showed no fracture and did show mild DJD of lumbar spine. Pt states that in 2017 she saw a specialist at a spine center and had to have injections in her back.  These symtpoms feel similar and she requests referral to [facility] in [place] for further evaluation  Past Medical History:  Diagnosis Date   Acute medial meniscal tear 10/02/2020   Agatston coronary artery calcium score less than 100 12/10/2019   Anxiety 10/04/2016   Atherosclerosis    Atypical chest pain 06/24/2019   Bilateral carpal tunnel syndrome 01/30/2021   Bursitis of shoulder, left 04/19/2021   Cellulitis 05/22/2021   Coronary artery calcification 12/10/2019   DDD (degenerative disc disease), lumbar 09/03/2016   Encounter for screening mammogram for malignant neoplasm of breast 08/24/2020   Essential hypertension 07/18/2016   Ex-smoker 12/10/2019   Frequent headaches 08/24/2020   Gastroesophageal reflux disease without esophagitis 07/18/2016   Hand numbness 01/30/2021   Incidental lung nodule 08/21/2021   Intractable migraine with aura without status migrainosus 07/18/2016   Menorrhagia with regular cycle 07/18/2016   Migraines    Mixed dyslipidemia 12/10/2019   Pulmonary nodules/lesions,  multiple 11/23/2019    Past Surgical History:  Procedure Laterality Date   CARPAL TUNNEL RELEASE Right 07/02/2021   CARPAL TUNNEL RELEASE  12/14/2021   CHOLECYSTECTOMY  2005   TUBAL LIGATION  2006    Family History  Problem Relation Age of Onset   Diabetes Mother    Hypertension Mother    Colon polyps Mother    Irritable bowel syndrome Mother    Kidney disease Mother    Heart Problems Father    Heart disease Father    Heart attack Maternal Grandfather    Heart disease Maternal Grandfather    Heart attack Paternal Grandmother    Lung cancer Paternal Grandfather    Stomach cancer Neg Hx    Colon cancer Neg Hx     Social History   Socioeconomic History   Marital status: Soil scientist    Spouse name: Not on file   Number of children: 1   Years of education: Not on file   Highest education level: Not on file  Occupational History   Occupation: Psychiatrist   Occupation: catering  Tobacco Use   Smoking status: Former    Types: Cigarettes    Start date: 04/09/2019   Smokeless tobacco: Never  Vaping Use   Vaping Use: Never used  Substance and Sexual Activity   Alcohol use: Never   Drug use: Never   Sexual activity: Not Currently    Partners: Male  Other Topics Concern   Not  on file  Social History Narrative   Not on file   Social Determinants of Health   Financial Resource Strain: Not on file  Food Insecurity: Not on file  Transportation Needs: Not on file  Physical Activity: Not on file  Stress: Not on file  Social Connections: Not on file  Intimate Partner Violence: Not on file    Outpatient Medications Prior to Visit  Medication Sig Dispense Refill   aspirin EC 81 MG tablet Take 81 mg by mouth daily.     atorvastatin (LIPITOR) 10 MG tablet TAKE ONE (1) TABLET BY MOUTH EVERY DAY 90 tablet 2   cetirizine (ZYRTEC) 10 MG tablet TAKE 1 TABLET BY MOUTH ONCE DAILY. 30 tablet 11   diclofenac Sodium (VOLTAREN) 1 % GEL Apply 1 application topically daily.      lisinopril (ZESTRIL) 10 MG tablet TAKE 1 TABLET BY MOUTH ONCE DAILY. 90 tablet 3   meloxicam (MOBIC) 15 MG tablet Take 15 mg by mouth daily.     Multiple Vitamins-Minerals (ZINC PO) Take 1 capsule by mouth daily.     omeprazole (PRILOSEC) 40 MG capsule TAKE 1 CAPSULE BY MOUTH ONCE DAILY. 30 capsule 3   pantoprazole (PROTONIX) 40 MG tablet Take 1 tablet (40 mg total) by mouth daily. 30 tablet 3   VITAMIN D PO Take 1 capsule by mouth daily.     No facility-administered medications prior to visit.    Allergies  Allergen Reactions   Shellfish Allergy Anaphylaxis, Itching, Nausea And Vomiting and Shortness Of Breath   Clindamycin Hives   Morphine And Related     Review of Systems CONSTITUTIONAL: Negative for chills, fatigue, fever,  CARDIOVASCULAR: Negative for chest pain, dizziness, palpitations  RESPIRATORY: Negative for recent cough and dyspnea.  GASTROINTESTINAL: Negative for abdominal pain, acid reflux symptoms, constipation, diarrhea, nausea and vomiting.  MSK:see HPI      Objective:   PHYSICAL EXAM:   VS: BP 120/74 (BP Location: Left Arm, Patient Position: Sitting, Cuff Size: Large)   Pulse 78   Temp 97.8 F (36.6 C) (Temporal)   Ht _0  (1.778 m)   Wt 258 lb (117 kg)   SpO2 98%   BMI 37.02 kg/m   GEN: Well nourished, well developed, in no acute distress  Cardiac: RRR; no murmurs,  Respiratory:  normal respiratory rate and pattern with no distress - normal breath sounds with no rales, rhonchi, wheezes or rubs MS: no deformity or atrophy - tender to palpation of left lower lumber area and with movement she has pain radiate down left leg Skin: warm and dry, no rash      Health Maintenance Due  Topic Date Due   PAP SMEAR-Modifier  Never done   MAMMOGRAM  05/14/2022   INFLUENZA VACCINE  Never done    There are no preventive care reminders to display for this patient.   Lab Results  Component Value Date   TSH 2.310 11/28/2021   Lab Results  Component  Value Date   WBC 6.2 07/17/2022   HGB 13.1 07/17/2022   HCT 39.7 07/17/2022   MCV 86 07/17/2022   PLT 308 07/17/2022   Lab Results  Component Value Date   NA 141 07/17/2022   K 4.4 07/17/2022   CO2 21 07/17/2022   GLUCOSE 89 07/17/2022   BUN 19 07/17/2022   CREATININE 0.98 07/17/2022   BILITOT 0.6 07/17/2022   ALKPHOS 91 07/17/2022   AST 18 07/17/2022   ALT 22 07/17/2022   PROT 6.5  07/17/2022   ALBUMIN 4.1 07/17/2022   CALCIUM 8.8 07/17/2022   EGFR 73 07/17/2022   Lab Results  Component Value Date   CHOL 151 11/28/2021   Lab Results  Component Value Date   HDL 60 11/28/2021   Lab Results  Component Value Date   LDLCALC 71 11/28/2021   Lab Results  Component Value Date   TRIG 110 11/28/2021   Lab Results  Component Value Date   CHOLHDL 2.5 11/28/2021   Lab Results  Component Value Date   HGBA1C 5.8 (H) 11/28/2021       Assessment & Plan:   Problem List Items Addressed This Visit   None Visit Diagnoses     Acute left-sided low back pain with left-sided sciatica    -  Primary   Relevant Medications   predniSONE (DELTASONE) 20 MG tablet   cyclobenzaprine (FLEXERIL) 5 MG tablet Continue meloxicam Rom exercises given Referral to Spine center given      Meds ordered this encounter  Medications   predniSONE (DELTASONE) 20 MG tablet    Sig: Take 3 tablets (60 mg total) by mouth daily with breakfast for 3 days, THEN 2 tablets (40 mg total) daily with breakfast for 3 days, THEN 1 tablet (20 mg total) daily with breakfast for 3 days.    Dispense:  18 tablet    Refill:  0    Order Specific Question:   Supervising Provider    Answer:   Shelton Silvas   cyclobenzaprine (FLEXERIL) 5 MG tablet    Sig: Take 1 tablet (5 mg total) by mouth at bedtime.    Dispense:  30 tablet    Refill:  0    Order Specific Question:   Supervising Provider    AnswerShelton Silvas    Orders Placed This Encounter  Procedures   Ambulatory referral to Spine  Surgery     Follow-up: Return if symptoms worsen or fail to improve.  An After Visit Summary was printed and given to the patient.  Yetta Flock [facility] [phone]

## 2022-11-02 ENCOUNTER — Other Ambulatory Visit: Payer: Self-pay | Admitting: Legal Medicine

## 2022-11-20 ENCOUNTER — Other Ambulatory Visit: Payer: Self-pay | Admitting: Physician Assistant

## 2022-11-20 NOTE — Telephone Encounter (Signed)
Is she taking prilosec or protonix now

## 2023-02-03 ENCOUNTER — Other Ambulatory Visit: Payer: Self-pay

## 2023-02-03 DIAGNOSIS — I10 Essential (primary) hypertension: Secondary | ICD-10-CM

## 2023-02-03 MED ORDER — LISINOPRIL 10 MG PO TABS: 10.0000 mg | ORAL_TABLET | Freq: Every day | ORAL | 0 refills | Status: DC

## 2023-02-06 ENCOUNTER — Other Ambulatory Visit: Payer: Self-pay

## 2023-02-06 DIAGNOSIS — R928 Other abnormal and inconclusive findings on diagnostic imaging of breast: Secondary | ICD-10-CM

## 2023-02-17 ENCOUNTER — Other Ambulatory Visit: Payer: Self-pay

## 2023-02-17 DIAGNOSIS — K219 Gastro-esophageal reflux disease without esophagitis: Secondary | ICD-10-CM

## 2023-02-17 MED ORDER — PANTOPRAZOLE SODIUM 40 MG PO TBEC: 40.0000 mg | DELAYED_RELEASE_TABLET | Freq: Every day | ORAL | 1 refills | Status: DC

## 2023-04-23 ENCOUNTER — Other Ambulatory Visit: Payer: Self-pay | Admitting: Physician Assistant

## 2023-04-23 DIAGNOSIS — I10 Essential (primary) hypertension: Secondary | ICD-10-CM

## 2023-05-02 ENCOUNTER — Other Ambulatory Visit: Payer: Self-pay | Admitting: Physician Assistant

## 2023-05-02 DIAGNOSIS — I10 Essential (primary) hypertension: Secondary | ICD-10-CM

## 2023-05-02 MED ORDER — LISINOPRIL 10 MG PO TABS: 10.0000 mg | ORAL_TABLET | Freq: Every day | ORAL | 0 refills | Status: DC

## 2023-05-07 ENCOUNTER — Encounter: Payer: Self-pay | Admitting: Cardiology

## 2023-05-07 ENCOUNTER — Ambulatory Visit: Payer: Commercial Managed Care - HMO | Attending: Cardiology | Admitting: Cardiology

## 2023-05-07 ENCOUNTER — Ambulatory Visit: Payer: Commercial Managed Care - HMO | Attending: Cardiology

## 2023-05-07 VITALS — BP 119/87 | HR 80 | Ht 70.0 in | Wt 251.0 lb

## 2023-05-07 DIAGNOSIS — Z87891 Personal history of nicotine dependence: Secondary | ICD-10-CM

## 2023-05-07 DIAGNOSIS — I10 Essential (primary) hypertension: Secondary | ICD-10-CM | POA: Diagnosis not present

## 2023-05-07 DIAGNOSIS — R002 Palpitations: Secondary | ICD-10-CM | POA: Diagnosis not present

## 2023-05-07 DIAGNOSIS — I2584 Coronary atherosclerosis due to calcified coronary lesion: Secondary | ICD-10-CM

## 2023-05-07 DIAGNOSIS — I2510 Atherosclerotic heart disease of native coronary artery without angina pectoris: Secondary | ICD-10-CM | POA: Diagnosis not present

## 2023-05-07 DIAGNOSIS — E785 Hyperlipidemia, unspecified: Secondary | ICD-10-CM

## 2023-05-07 NOTE — Patient Instructions (Signed)
Medication Instructions:  Your physician recommends that you continue on your current medications as directed. Please refer to the Current Medication list given to you today.  *If you need a refill on your cardiac medications before your next appointment, please call your pharmacy*   Lab Work: Your physician recommends that you return for lab work in:   Labs today: Lipid, TSH  If you have labs (blood work) drawn today and your tests are completely normal, you will receive your results only by: MyChart Message (if you have MyChart) OR A paper copy in the mail If you have any lab test that is abnormal or we need to change your treatment, we will call you to review the results.   Testing/Procedures: A zio monitor was ordered today. It will remain on for 14 days. You will then return monitor and event diary in provided box. It takes 1-2 weeks for report to be downloaded and returned to [place]. We will call you with the results. If monitor falls off or has orange flashing light, please call Zio for further instructions.     Follow-Up: At [facility], you and your health needs are our priority.  As part of our continuing mission to provide you with exceptional heart care, we have created designated Provider Care Teams.  These Care Teams include your primary Cardiologist (physician) and Advanced Practice Providers (APPs -  Physician Assistants and Nurse Practitioners) who all work together to provide you with the care you need, when you need it.  We recommend signing up for the patient portal called "MyChart".  Sign up information is provided on this After Visit Summary.  MyChart is used to connect with patients for Virtual Visits (Telemedicine).  Patients are able to view lab/test results, encounter notes, upcoming appointments, etc.  Non-urgent messages can be sent to your provider as well.   To learn more about what you can do with MyChart, go to [email or web address].    Your next  appointment:   1 year(s)  Provider:   Gypsy Balsam, MD    Other Instructions Patient declined EKG chaperone

## 2023-05-07 NOTE — Progress Notes (Signed)
Cardiology Office Note:    Date:  05/07/2023   ID:  Marie Cox, DOB 01-10-1981, MRN [id number]  PCP:  Marianne Sofia, PA-C  Cardiologist:  Gypsy Balsam, MD    Referring MD: Marianne Sofia, PA-C   Chief Complaint  Patient presents with   Follow-up  Doing fine but palpitations  History of Present Illness:    Marie Cox is a 42 y.o. female past medical history significant for calcification of the coronary artery initially discovered in August 2020 cardiac coronary calcium score was 7 she got small chunk of calcium in the proximal LAD likely she was completely asymptomatic at that time she was put on aspirin as well as cholesterol-lowering medication.  She have not been seen for about 2 years she comes now because she complained having some palpitations.  She could be sitting watching TV and suddenly her heart will speed up.  There is no chest pain no shortness of breath no sweatiness associated with this sensation when she gets a little dizzy never to the point of passing out otherwise she is doing well.  Denies have any chest pain tightness squeezing pressure burning chest, she walks on the regular basis with her dog and have no difficulty doing it  Past Medical History:  Diagnosis Date   Acute medial meniscal tear 10/02/2020   Agatston coronary artery calcium score less than 100 12/10/2019   Anxiety 10/04/2016   Atherosclerosis    Atypical chest pain 06/24/2019   Bilateral carpal tunnel syndrome 01/30/2021   Bursitis of shoulder, left 04/19/2021   Cellulitis 05/22/2021   Coronary artery calcification 12/10/2019   DDD (degenerative disc disease), lumbar 09/03/2016   Encounter for screening mammogram for malignant neoplasm of breast 08/24/2020   Essential hypertension 07/18/2016   Ex-smoker 12/10/2019   Frequent headaches 08/24/2020   Gastroesophageal reflux disease without esophagitis 07/18/2016   Hand numbness 01/30/2021   Incidental lung nodule 08/21/2021   Intractable migraine  with aura without status migrainosus 07/18/2016   Menorrhagia with regular cycle 07/18/2016   Migraines    Mixed dyslipidemia 12/10/2019   Pulmonary nodules/lesions, multiple 11/23/2019    Past Surgical History:  Procedure Laterality Date   CARPAL TUNNEL RELEASE Right 07/02/2021   CARPAL TUNNEL RELEASE  12/14/2021   CHOLECYSTECTOMY  2005   TUBAL LIGATION  2006    Current Medications: Current Meds  Medication Sig   aspirin EC 81 MG tablet Take 81 mg by mouth daily.   atorvastatin (LIPITOR) 10 MG tablet TAKE ONE (1) TABLET BY MOUTH EVERY DAY   cetirizine (ZYRTEC) 10 MG tablet TAKE 1 TABLET BY MOUTH ONCE DAILY.   diclofenac Sodium (VOLTAREN) 1 % GEL Apply 1 application topically daily.   lisinopril (ZESTRIL) 10 MG tablet Take 1 tablet (10 mg total) by mouth daily.   Multiple Vitamins-Minerals (ZINC PO) Take 1 capsule by mouth daily.   pantoprazole (PROTONIX) 40 MG tablet Take 1 tablet (40 mg total) by mouth daily.   tiZANidine (ZANAFLEX) 4 MG tablet Take 4 mg by mouth every 6 (six) hours as needed for muscle spasms.   VITAMIN D PO Take 1 capsule by mouth daily.     Allergies:   Shellfish allergy, Clindamycin, and Morphine and codeine   Social History   Socioeconomic History   Marital status: Media planner    Spouse name: Not on file   Number of children: 1   Years of education: Not on file   Highest education level: Not on file  Occupational History  Occupation: Forensic psychologist   Occupation: catering  Tobacco Use   Smoking status: Former    Types: Cigarettes    Start date: 04/09/2019   Smokeless tobacco: Never  Vaping Use   Vaping Use: Never used  Substance and Sexual Activity   Alcohol use: Never   Drug use: Never   Sexual activity: Not Currently    Partners: Male  Other Topics Concern   Not on file  Social History Narrative   Not on file   Social Determinants of Health   Financial Resource Strain: Not on file  Food Insecurity: Not on file  Transportation  Needs: Not on file  Physical Activity: Not on file  Stress: Not on file  Social Connections: Not on file     Family History: The patient's family history includes Colon polyps in her mother; Diabetes in her mother; Heart Problems in her father; Heart attack in her maternal grandfather and paternal grandmother; Heart disease in her father and maternal grandfather; Hypertension in her mother; Irritable bowel syndrome in her mother; Kidney disease in her mother; Lung cancer in her paternal grandfather. There is no history of Stomach cancer or Colon cancer. ROS:   Please see the history of present illness.    All 14 point review of systems negative except as described per history of present illness  EKGs/Labs/Other Studies Reviewed:      Recent Labs: 07/17/2022: ALT 22; BUN 19; Creatinine, Ser 0.98; Hemoglobin 13.1; Platelets 308; Potassium 4.4; Sodium 141  Recent Lipid Panel    Component Value Date/Time   CHOL 151 11/28/2021 1026   TRIG 110 11/28/2021 1026   HDL 60 11/28/2021 1026   CHOLHDL 2.5 11/28/2021 1026   LDLCALC 71 11/28/2021 1026    Physical Exam:    VS:  BP 119/87 (BP Location: Left Arm, Patient Position: Sitting, Cuff Size: Normal)   Pulse 80   Ht 5\' 10"  (1.778 m)   Wt 251 lb (113.9 kg)   SpO2 97%   BMI 36.01 kg/m     Wt Readings from Last 3 Encounters:  05/07/23 251 lb (113.9 kg)  10/30/22 258 lb (117 kg)  10/17/22 254 lb (115.2 kg)     GEN:  Well nourished, well developed in no acute distress HEENT: Normal NECK: No JVD; No carotid bruits LYMPHATICS: No lymphadenopathy CARDIAC: RRR, no murmurs, no rubs, no gallops RESPIRATORY:  Clear to auscultation without rales, wheezing or rhonchi  ABDOMEN: Soft, non-tender, non-distended MUSCULOSKELETAL:  No edema; No deformity  SKIN: Warm and dry LOWER EXTREMITIES: no swelling NEUROLOGIC:  Alert and oriented x 3 PSYCHIATRIC:  Normal affect   ASSESSMENT:    1. Palpitations   2. Coronary artery calcification    3. Essential hypertension   4. Ex-smoker   5. Dyslipidemia    PLAN:    In order of problems listed above:  Coronary artery disease in form of calcification of the coronary artery risk factors modification which we are doing.  Continue antiplatelet therapy, I will ask her to have fasting lipid profile done. Palpitation which is new issue.  I will ask her to have Zio patch for 2 weeks to see if she get any significant arrhythmia. Essential hypertension blood pressure seems to well-controlled we will continue present management. Dyslipidemia I did review K PN to have the data I have is from 2022 LDL of 71 HDL 60 we will recheck her fasting lipid profile   Medication Adjustments/Labs and Tests Ordered: Current medicines are reviewed at length with the patient  today.  Concerns regarding medicines are outlined above.  Orders Placed This Encounter  Procedures   Lipid Profile   TSH   LONG TERM MONITOR (3-14 DAYS)   Medication changes: No orders of the defined types were placed in this encounter.   Signed, Marie Lea, MD, [facility] 05/07/2023 12:08 PM    [facility]

## 2023-05-08 LAB — LIPID PANEL
Chol/HDL Ratio: 3.3 ratio (ref 0.0–4.4)
Cholesterol, Total: 161 mg/dL (ref 100–199)
HDL: 49 mg/dL (ref >39)
LDL Chol Calc (NIH): 91 mg/dL (ref 0–99)
Triglycerides: 119 mg/dL (ref 0–149)
VLDL Cholesterol Cal: 21 mg/dL (ref 5–40)

## 2023-05-08 LAB — TSH: TSH: 2.09 u[IU]/mL (ref 0.450–4.500)

## 2023-05-14 ENCOUNTER — Other Ambulatory Visit: Payer: Self-pay | Admitting: Family Medicine

## 2023-05-14 DIAGNOSIS — E782 Mixed hyperlipidemia: Secondary | ICD-10-CM

## 2023-05-14 NOTE — Telephone Encounter (Signed)
Please call pt and schedule fasting appt - then will refill meds

## 2023-05-15 NOTE — Telephone Encounter (Signed)
Appointment has been made

## 2023-05-15 NOTE — Telephone Encounter (Signed)
Left voicemail for patient to call back and schedule follow up appointment.

## 2023-05-21 ENCOUNTER — Telehealth: Payer: Self-pay

## 2023-05-21 DIAGNOSIS — E785 Hyperlipidemia, unspecified: Secondary | ICD-10-CM

## 2023-05-21 MED ORDER — ATORVASTATIN CALCIUM 20 MG PO TABS: 20.0000 mg | ORAL_TABLET | Freq: Every day | ORAL | 3 refills | Status: DC

## 2023-05-21 NOTE — Addendum Note (Signed)
Addended by: Baldo Ash D on: 05/21/2023 11:11 AM   Modules accepted: Orders

## 2023-05-21 NOTE — Telephone Encounter (Signed)
Left message on My Chart with normal results per Dr. Krasowski's note. Routed to PCP. 

## 2023-06-03 ENCOUNTER — Other Ambulatory Visit: Payer: Self-pay | Admitting: Physician Assistant

## 2023-06-03 DIAGNOSIS — I10 Essential (primary) hypertension: Secondary | ICD-10-CM

## 2023-06-03 LAB — LIPID PANEL
Chol/HDL Ratio: 2.7 ratio (ref 0.0–4.4)
Cholesterol, Total: 137 mg/dL (ref 100–199)
HDL: 51 mg/dL (ref >39)
LDL Chol Calc (NIH): 65 mg/dL (ref 0–99)
Triglycerides: 117 mg/dL (ref 0–149)
VLDL Cholesterol Cal: 21 mg/dL (ref 5–40)

## 2023-06-03 LAB — ALT: ALT: 14 IU/L (ref 0–32)

## 2023-06-03 LAB — AST: AST: 12 IU/L (ref 0–40)

## 2023-06-06 ENCOUNTER — Telehealth: Payer: Self-pay

## 2023-06-06 NOTE — Telephone Encounter (Signed)
Left message on My Chart with normal cholesterol results per Dr. Krasowski's note. Routed to PCP.   

## 2023-06-09 ENCOUNTER — Telehealth: Payer: Self-pay

## 2023-06-09 NOTE — Telephone Encounter (Signed)
Patient is requesting a call back to discuss results. 

## 2023-06-09 NOTE — Telephone Encounter (Signed)
Left vm for pt to callback 

## 2023-06-09 NOTE — Telephone Encounter (Signed)
Advised that Dr. Bing Matter has not reviewed the results and once reviewed we will advise her of findings and recommendations.

## 2023-06-09 NOTE — Telephone Encounter (Signed)
Pt viewed results in My Chart per Dr. Krasowski's note. Routed to PCP.  

## 2023-06-17 ENCOUNTER — Ambulatory Visit (INDEPENDENT_AMBULATORY_CARE_PROVIDER_SITE_OTHER): Payer: Commercial Managed Care - HMO | Admitting: Physician Assistant

## 2023-06-17 ENCOUNTER — Encounter: Payer: Self-pay | Admitting: Physician Assistant

## 2023-06-17 VITALS — BP 116/76 | HR 66 | Temp 97.3°F | Ht 70.0 in | Wt 248.8 lb

## 2023-06-17 DIAGNOSIS — L600 Ingrowing nail: Secondary | ICD-10-CM | POA: Insufficient documentation

## 2023-06-17 DIAGNOSIS — K219 Gastro-esophageal reflux disease without esophagitis: Secondary | ICD-10-CM | POA: Diagnosis not present

## 2023-06-17 DIAGNOSIS — I10 Essential (primary) hypertension: Secondary | ICD-10-CM | POA: Diagnosis not present

## 2023-06-17 DIAGNOSIS — R002 Palpitations: Secondary | ICD-10-CM | POA: Insufficient documentation

## 2023-06-17 DIAGNOSIS — B351 Tinea unguium: Secondary | ICD-10-CM | POA: Diagnosis not present

## 2023-06-17 DIAGNOSIS — R7303 Prediabetes: Secondary | ICD-10-CM | POA: Diagnosis not present

## 2023-06-17 LAB — CBC WITH DIFFERENTIAL/PLATELET
Basophils Absolute: 0.1 10*3/uL (ref 0.0–0.2)
Basos: 1 %
EOS (ABSOLUTE): 0.2 10*3/uL (ref 0.0–0.4)
Eos: 4 %
Hematocrit: 40.1 % (ref 34.0–46.6)
Hemoglobin: 13 g/dL (ref 11.1–15.9)
Immature Grans (Abs): 0 10*3/uL (ref 0.0–0.1)
Immature Granulocytes: 0 %
Lymphocytes Absolute: 0.7 10*3/uL (ref 0.7–3.1)
Lymphs: 14 %
MCH: 27.8 pg (ref 26.6–33.0)
MCHC: 32.4 g/dL (ref 31.5–35.7)
MCV: 86 fL (ref 79–97)
Monocytes Absolute: 0.5 10*3/uL (ref 0.1–0.9)
Monocytes: 10 %
Neutrophils Absolute: 3.4 10*3/uL (ref 1.4–7.0)
Neutrophils: 71 %
Platelets: 375 10*3/uL (ref 150–450)
RBC: 4.67 x10E6/uL (ref 3.77–5.28)
RDW: 14.8 % (ref 11.7–15.4)
WBC: 4.8 10*3/uL (ref 3.4–10.8)

## 2023-06-17 LAB — COMPREHENSIVE METABOLIC PANEL
ALT: 16 IU/L (ref 0–32)
AST: 13 IU/L (ref 0–40)
Albumin: 4 g/dL (ref 3.9–4.9)
Alkaline Phosphatase: 103 IU/L (ref 44–121)
BUN/Creatinine Ratio: 15 (ref 9–23)
BUN: 13 mg/dL (ref 6–24)
Bilirubin Total: 0.7 mg/dL (ref 0.0–1.2)
CO2: 24 mmol/L (ref 20–29)
Calcium: 9.7 mg/dL (ref 8.7–10.2)
Chloride: 102 mmol/L (ref 96–106)
Creatinine, Ser: 0.85 mg/dL (ref 0.57–1.00)
Globulin, Total: 2.6 g/dL (ref 1.5–4.5)
Glucose: 86 mg/dL (ref 70–99)
Potassium: 4.9 mmol/L (ref 3.5–5.2)
Sodium: 140 mmol/L (ref 134–144)
Total Protein: 6.6 g/dL (ref 6.0–8.5)
eGFR: 87 mL/min/{1.73_m2} (ref >59)

## 2023-06-17 LAB — HEMOGLOBIN A1C
Est. average glucose Bld gHb Est-mCnc: 117 mg/dL
Hgb A1c MFr Bld: 5.7 % — ABNORMAL HIGH (ref 4.8–5.6)

## 2023-06-17 NOTE — Progress Notes (Signed)
Subjective:  Patient ID: Marie Cox, female    DOB: [birth year]  Age: 42 y.o. MRN: [id number]  Chief Complaint  Patient presents with   Medical Management of Chronic Issues    HPI Pt presents for follow up of hypertension.The patient is tolerating the medication well without side effects. Compliance with treatment has been good; including taking medication as directed , maintains a healthy diet and regular exercise regimen , and following up as directed. Currently taking zestril 10mg  every day  Mixed hyperlipidemia  Pt presents with hyperlipidemia.Compliance with treatment has been good -The patient is compliant with medications, maintains a low cholesterol diet , follows up as directed , and maintains an exercise regimen . The patient denies experiencing any hypercholesterolemia related symptoms. 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 - pt will follow up with cardiology regarding these issues   Pt has right great toenail that is thick and distorted - would like referral to podiatry  Pt with history of prediabetes - due to repeat labwork       06/17/2023    8:17 AM 08/21/2021   10:07 AM 08/24/2020   11:16 AM  Depression screen PHQ 2/9  Decreased Interest 0 0 0  Down, Depressed, Hopeless 0 0 0  PHQ - 2 Score 0 0 0  Altered sleeping 1    Tired, decreased energy 2    Change in appetite 0    Feeling bad or failure about yourself  0    Trouble concentrating 0    Moving slowly or fidgety/restless 0    Suicidal thoughts 0    PHQ-9 Score 3    Difficult doing work/chores Not difficult at all          05/22/2021    9:50 AM 08/21/2021   10:07 AM 06/17/2023    8:17 AM  Fall Risk  Falls in the past year? 0 0 0  Was there an  injury with Fall? 0 0 0  Fall Risk Category Calculator 0 0 0  Fall Risk Category (Retired) Low Low   (RETIRED) Patient Fall Risk Level Low fall risk Low fall risk   Patient at Risk for Falls Due to   No Fall Risks  Fall risk Follow up Falls evaluation completed  Falls evaluation completed     ROS CONSTITUTIONAL: Negative for chills, fatigue, fever, unintentional weight gain and unintentional weight loss.  E/N/T: Negative for ear pain, nasal congestion and sore throat.  CARDIOVASCULAR:intermittent palpitations RESPIRATORY: Negative for recent cough and dyspnea.  GASTROINTESTINAL: Negative for abdominal pain, acid reflux symptoms, constipation, diarrhea, nausea and vomiting.  MSK: Negative for arthralgias and myalgias.  INTEGUMENTARY: see HPI NEUROLOGICAL: Negative for dizziness and headaches.  PSYCHIATRIC: Negative for sleep disturbance and to question depression screen.  Negative for depression, negative for anhedonia.    Current Outpatient Medications:    aspirin EC 81 MG tablet, Take 81 mg by mouth daily., Disp: , Rfl:    atorvastatin (LIPITOR) 20 MG tablet, Take 1 tablet (20 mg total) by mouth daily., Disp: 90 tablet, Rfl: 3   cetirizine (ZYRTEC) 10 MG tablet, TAKE 1 TABLET BY MOUTH ONCE DAILY., Disp: 30 tablet, Rfl: 11   diclofenac (VOLTAREN) 50 MG EC tablet, Take by  mouth., Disp: , Rfl:    diclofenac Sodium (VOLTAREN) 1 % GEL, Apply 1 application topically daily., Disp: , Rfl:    lisinopril (ZESTRIL) 10 MG tablet, Take 1 tablet by mouth once daily., Disp: 30 tablet, Rfl: 0   Multiple Vitamins-Minerals (ZINC PO), Take 1 capsule by mouth daily., Disp: , Rfl:    pantoprazole (PROTONIX) 40 MG tablet, Take 1 tablet (40 mg total) by mouth daily., Disp: 90 tablet, Rfl: 1   tiZANidine (ZANAFLEX) 4 MG tablet, Take 4 mg by mouth every 6 (six) hours as needed for muscle spasms., Disp: , Rfl:   Past Medical History:  Diagnosis Date   Acute medial meniscal tear 10/02/2020   Agatston  coronary artery calcium score less than 100 12/10/2019   Anxiety 10/04/2016   Atherosclerosis    Atypical chest pain 06/24/2019   Bilateral carpal tunnel syndrome 01/30/2021   Bursitis of shoulder, left 04/19/2021   Cellulitis 05/22/2021   Coronary artery calcification 12/10/2019   DDD (degenerative disc disease), lumbar 09/03/2016   Encounter for screening mammogram for malignant neoplasm of breast 08/24/2020   Essential hypertension 07/18/2016   Ex-smoker 12/10/2019   Frequent headaches 08/24/2020   Gastroesophageal reflux disease without esophagitis 07/18/2016   Hand numbness 01/30/2021   Incidental lung nodule 08/21/2021   Intractable migraine with aura without status migrainosus 07/18/2016   Menorrhagia with regular cycle 07/18/2016   Migraines    Mixed dyslipidemia 12/10/2019   Pulmonary nodules/lesions, multiple 11/23/2019   Objective:  PHYSICAL EXAM:   BP 116/76 (BP Location: Left Arm, Patient Position: Sitting, Cuff Size: Large)   Pulse 66   Temp (!) 97.3 F (36.3 C) (Temporal)   Ht 5\' 10"  (1.778 m)   Wt 248 lb 12.8 oz (112.9 kg)   SpO2 98%   BMI 35.70 kg/m    GEN: Well nourished, well developed, in no acute distress  Cardiac: RRR; no murmurs, rubs, or gallops,no edema -  Respiratory:  normal respiratory rate and pattern with no distress - normal breath sounds with no rales, rhonchi, wheezes or rubs Skin:thickened great toenail and slightly ingrown of first toe right foot  Psych: euthymic mood, appropriate affect and demeanor  Assessment & Plan:    Essential hypertension -     CBC with Differential/Platelet -     Comprehensive metabolic panel Continue med Gastroesophageal reflux disease without esophagitis Continue med Prediabetes -     Hemoglobin A1c Watch diet Onychomycosis -     Ambulatory referral to Podiatry  Ingrown toenail of right foot -     Ambulatory referral to Podiatry Palpitations Follow up with cardiology     Follow-up: Return in about 6 months  (around 12/17/2023) for fasting physical with pap - 40 min.  An After Visit Summary was printed and given to the patient.  Jettie Pagan [facility] [phone]

## 2023-06-20 ENCOUNTER — Encounter: Payer: Self-pay | Admitting: Cardiology

## 2023-06-23 ENCOUNTER — Ambulatory Visit: Payer: Commercial Managed Care - HMO | Admitting: Podiatry

## 2023-06-23 DIAGNOSIS — L603 Nail dystrophy: Secondary | ICD-10-CM

## 2023-06-23 DIAGNOSIS — S93421A Sprain of deltoid ligament of right ankle, initial encounter: Secondary | ICD-10-CM | POA: Diagnosis not present

## 2023-06-23 DIAGNOSIS — B351 Tinea unguium: Secondary | ICD-10-CM

## 2023-06-23 MED ORDER — CICLOPIROX 8 % EX SOLN: Freq: Every day | CUTANEOUS | 0 refills | Status: DC

## 2023-06-23 MED ORDER — TERBINAFINE HCL 250 MG PO TABS: 250.0000 mg | ORAL_TABLET | Freq: Every day | ORAL | 2 refills | Status: AC

## 2023-06-23 MED ORDER — METHYLPREDNISOLONE 4 MG PO TBPK
ORAL_TABLET | ORAL | 0 refills | Status: DC
Start: 2023-06-23 — End: 2023-10-01

## 2023-06-23 NOTE — Progress Notes (Signed)
Subjective:  Patient ID: Marie Cox, female    DOB: November 11, 1981,  MRN: [id number]  Chief Complaint  Patient presents with   Ingrown Toenail    Right great toe ingrown. Pain to medial border.    Nail Problem    Nail fungus right great & 2nd toes. Has never been treated for nail fungus.     42 y.o. female presents with son for pain in the right great toe on the lateral border.  She states that she has some thickening and discoloration of the nail at that area and that is causing pain due to the thickness of the nail.  Feels like it may be ingrown at the tip of the nail.  Talked her primary care and they were discussing antifungal medications but the primary care doctor said to follow-up with me for further opinion on this.  She recently had blood work done and says everything was normal she denies any liver issues and does not take any blood thinners such as Eliquis or Xarelto.  She also reports she recently had an ankle injury when she stepped off a curb and had some pain and swelling along the inside of her right ankle.  Has been wrapping the ankle with an Ace wrap which is helping and she is now able to put weight on it.  Does not have any boot or brace  Past Medical History:  Diagnosis Date   Acute medial meniscal tear 10/02/2020   Agatston coronary artery calcium score less than 100 12/10/2019   Anxiety 10/04/2016   Atherosclerosis    Atypical chest pain 06/24/2019   Bilateral carpal tunnel syndrome 01/30/2021   Bursitis of shoulder, left 04/19/2021   Cellulitis 05/22/2021   Coronary artery calcification 12/10/2019   DDD (degenerative disc disease), lumbar 09/03/2016   Encounter for screening mammogram for malignant neoplasm of breast 08/24/2020   Essential hypertension 07/18/2016   Ex-smoker 12/10/2019   Frequent headaches 08/24/2020   Gastroesophageal reflux disease without esophagitis 07/18/2016   Hand numbness 01/30/2021   Incidental lung nodule 08/21/2021   Intractable migraine with  aura without status migrainosus 07/18/2016   Menorrhagia with regular cycle 07/18/2016   Migraines    Mixed dyslipidemia 12/10/2019   Pulmonary nodules/lesions, multiple 11/23/2019    Allergies  Allergen Reactions   Shellfish Allergy Anaphylaxis, Itching, Nausea And Vomiting and Shortness Of Breath   Clindamycin Hives   Morphine And Codeine     ROS: Negative except as per HPI above  Objective:  General: AAO x3, NAD  Dermatological: Thickening ridging and onycholysis as well as dystrophy at the lateral aspect of the right hallux nail.  Pain with palpation of the distal medial aspect of the right hallux nail  Vascular:  Dorsalis Pedis artery and Posterior Tibial artery pedal pulses are 2/4 bilateral.  Capillary fill time < 3 sec to all digits.   Neruologic: Grossly intact via light touch bilateral. Protective threshold intact to all sites bilateral.   Musculoskeletal: Edema and ecchymosis is noted along the medial aspect of the right ankle.  Pain with palpation along the course the posterior tibial tendon along the deltoid ligament  Gait: Unassisted, Nonantalgic.   No images are attached to the encounter.  Radiographs:  Deferred Assessment:   1. Nail dystrophy   2. Onychomycosis   3. Sprain of deltoid ligament of right ankle, initial encounter      Plan:  Patient was evaluated and treated and all questions answered.  #Onychomycosis/nail dystrophy of the right hallux  nail lateral border -Perform slant back procedure with nail nipper and that produced pain at the distal tip of the right hallux lateral nail -Educated on etiology of nail fungus. -Nail sample taken for microbiology and histology. -Baseline liver function studies recently completed and normal -eRx for oral terbinafine 250 mg take once daily for the next 90 days #30 with 2 refill. Educated on risks and benefits of the medication including risk of liver toxicity.  #PT tendon injury/ deltoid injury R  ankle -Recommend immobilization in cam boot for the right ankle -Dispensed cam boot and instructed patient to decrease activity and decrease weightbearing -eRx for methylprednisolone 4 mg steroid taper pack take as directed for 6 days for pain and inflammation of the right ankle -Recommend ibuprofen for pain as well as icing and compression with Ace wrap  Return in about 3 months (around 09/23/2023) for F/u R hallux nail border.          Corinna Gab, DPM [facility] / [facility]

## 2023-06-30 ENCOUNTER — Other Ambulatory Visit: Payer: Self-pay | Admitting: Physician Assistant

## 2023-06-30 DIAGNOSIS — I10 Essential (primary) hypertension: Secondary | ICD-10-CM

## 2023-07-02 ENCOUNTER — Encounter: Payer: Self-pay | Admitting: Physician Assistant

## 2023-07-03 NOTE — Telephone Encounter (Signed)
Yes - go ahead and put in order -- see if she wants at facility or home study

## 2023-07-04 ENCOUNTER — Other Ambulatory Visit: Payer: Self-pay | Admitting: Physician Assistant

## 2023-07-04 DIAGNOSIS — R002 Palpitations: Secondary | ICD-10-CM

## 2023-07-04 DIAGNOSIS — E6609 Other obesity due to excess calories: Secondary | ICD-10-CM

## 2023-07-10 ENCOUNTER — Telehealth: Payer: Self-pay | Admitting: *Deleted

## 2023-07-10 NOTE — Telephone Encounter (Signed)
I called and informed the patient that Dr. Annamary Rummage got the [facility] lab results back and it states that it's negative for Fungus.  I told her that Dr. Annamary Rummage said to discontinue the Lamisil.  Patient stated understanding.

## 2023-07-14 ENCOUNTER — Encounter: Payer: Self-pay | Admitting: Podiatry

## 2023-07-31 ENCOUNTER — Telehealth: Payer: Self-pay

## 2023-07-31 ENCOUNTER — Other Ambulatory Visit: Payer: Self-pay

## 2023-07-31 DIAGNOSIS — R002 Palpitations: Secondary | ICD-10-CM

## 2023-07-31 DIAGNOSIS — E6609 Other obesity due to excess calories: Secondary | ICD-10-CM

## 2023-07-31 NOTE — Telephone Encounter (Signed)
Called patient made her aware sleep study was positive and CPAP will be ordered.   Patient Made Aware, Verbalized Understanding.

## 2023-08-04 LAB — HM MAMMOGRAPHY

## 2023-08-11 ENCOUNTER — Telehealth: Payer: Self-pay

## 2023-08-11 NOTE — Telephone Encounter (Signed)
[facility] [employer]: letter of medical necessity

## 2023-08-12 ENCOUNTER — Other Ambulatory Visit: Payer: Self-pay

## 2023-08-12 MED ORDER — CETIRIZINE HCL 10 MG PO TABS: 10.0000 mg | ORAL_TABLET | Freq: Every day | ORAL | 11 refills | Status: DC

## 2023-08-14 ENCOUNTER — Other Ambulatory Visit: Payer: Self-pay | Admitting: Physician Assistant

## 2023-08-14 DIAGNOSIS — K219 Gastro-esophageal reflux disease without esophagitis: Secondary | ICD-10-CM

## 2023-09-15 ENCOUNTER — Other Ambulatory Visit: Payer: Self-pay

## 2023-09-15 MED ORDER — ATORVASTATIN CALCIUM 20 MG PO TABS: 20.0000 mg | ORAL_TABLET | Freq: Every day | ORAL | 1 refills | Status: DC

## 2023-09-22 ENCOUNTER — Ambulatory Visit: Payer: Commercial Managed Care - HMO | Admitting: Podiatry

## 2023-09-22 DIAGNOSIS — L600 Ingrowing nail: Secondary | ICD-10-CM

## 2023-09-22 DIAGNOSIS — L603 Nail dystrophy: Secondary | ICD-10-CM

## 2023-09-22 NOTE — Patient Instructions (Signed)

## 2023-09-22 NOTE — Progress Notes (Signed)
Subjective:  Patient ID: Marie Cox, female    DOB: Sep 01, 1981,  MRN: [id number]  Chief Complaint  Patient presents with   Follow-up    F/u right hallux nail discoloration. Patient stopped taking the Lamisil due to no fungus detected in the nail. Patient does have pain to the nail.     42 y.o. female presents with concern for 4 follow-up on right hallux nail discoloration.  She stopped taking Lamisil as the nail sample came back negative for fungal infection.  This is more likely due to trauma or any pedicure the patient had.  Patient does have pain to the nail with any pressure on the top of the nail is thickened and dystrophic.  Past Medical History:  Diagnosis Date   Acute medial meniscal tear 10/02/2020   Agatston coronary artery calcium score less than 100 12/10/2019   Anxiety 10/04/2016   Atherosclerosis    Atypical chest pain 06/24/2019   Bilateral carpal tunnel syndrome 01/30/2021   Bursitis of shoulder, left 04/19/2021   Cellulitis 05/22/2021   Coronary artery calcification 12/10/2019   DDD (degenerative disc disease), lumbar 09/03/2016   Encounter for screening mammogram for malignant neoplasm of breast 08/24/2020   Essential hypertension 07/18/2016   Ex-smoker 12/10/2019   Frequent headaches 08/24/2020   Gastroesophageal reflux disease without esophagitis 07/18/2016   Hand numbness 01/30/2021   Incidental lung nodule 08/21/2021   Intractable migraine with aura without status migrainosus 07/18/2016   Menorrhagia with regular cycle 07/18/2016   Migraines    Mixed dyslipidemia 12/10/2019   Pulmonary nodules/lesions, multiple 11/23/2019    Allergies  Allergen Reactions   Shellfish Allergy Anaphylaxis, Itching, Nausea And Vomiting and Shortness Of Breath   Clindamycin Hives   Morphine And Codeine     ROS: Negative except as per HPI above  Objective:  General: AAO x3, NAD  Dermatological: Right hallux with pain on palpation and dystrophy noted thickening subungual debris  discoloration.  Vascular:  Dorsalis Pedis artery and Posterior Tibial artery pedal pulses are 2/4 bilateral.  Capillary fill time < 3 sec to all digits.   Neruologic: Grossly intact via light touch bilateral. Protective threshold intact to all sites bilateral.   Musculoskeletal: No gross boney pedal deformities bilateral. No pain, crepitus, or limitation noted with foot and ankle range of motion bilateral. Muscular strength 5/5 in all groups tested bilateral.  Gait: Unassisted, Nonantalgic.   No images are attached to the encounter.   Assessment:   1. Ingrown nail of great toe of right foot   2. Nail dystrophy      Plan:  Patient was evaluated and treated and all questions answered.    Ingrown Nail, right -Patient elects to proceed with minor surgery to remove ingrown toenail today. Consent reviewed and signed by patient. -Patient elected not to have permanent removal she wanted just an avulsion procedure at this time we we will allow the nail a chance to regrow without dystrophy -Ingrown nail excised. See procedure note. -Educated on post-procedure care including soaking. Written instructions provided and reviewed. -Patient to follow up in 2 weeks for nail check.  Procedure: Excision of Ingrown Toenail Location: Right 1st toe  total  nail  Anesthesia: Lidocaine 1% plain; 1.5 mL and Marcaine 0.5% plain; 1.5 mL, digital block. Skin Prep: Betadine. Dressing: Silvadene; telfa; dry, sterile, compression dressing. Technique: Following skin prep, the toe was exsanguinated and a tourniquet was secured at the base of the toe. The affected nail border was freed, split with a nail  splitter, and excised.  Nailbed was irrigated out with alcohol. The tourniquet was then removed and sterile dressing applied. Disposition: Patient tolerated procedure well. Patient to return in 2 weeks for follow-up.    Return in about 2 weeks (around 10/06/2023) for nail check.          Corinna Gab,  DPM [facility] / [facility]

## 2023-09-24 ENCOUNTER — Other Ambulatory Visit: Payer: Self-pay | Admitting: Physician Assistant

## 2023-09-24 DIAGNOSIS — I10 Essential (primary) hypertension: Secondary | ICD-10-CM

## 2023-09-27 ENCOUNTER — Other Ambulatory Visit: Payer: Self-pay

## 2023-09-27 DIAGNOSIS — K219 Gastro-esophageal reflux disease without esophagitis: Secondary | ICD-10-CM

## 2023-09-29 MED ORDER — PANTOPRAZOLE SODIUM 40 MG PO TBEC
40.0000 mg | DELAYED_RELEASE_TABLET | Freq: Every day | ORAL | 0 refills | Status: DC
Start: 2023-09-29 — End: 2023-12-31

## 2023-09-30 ENCOUNTER — Encounter: Payer: Self-pay | Admitting: Podiatry

## 2023-10-01 ENCOUNTER — Ambulatory Visit (INDEPENDENT_AMBULATORY_CARE_PROVIDER_SITE_OTHER): Payer: Managed Care, Other (non HMO) | Admitting: Physician Assistant

## 2023-10-01 ENCOUNTER — Encounter: Payer: Self-pay | Admitting: Physician Assistant

## 2023-10-01 VITALS — BP 110/80 | HR 71 | Temp 97.0°F | Ht 67.25 in | Wt 242.0 lb

## 2023-10-01 DIAGNOSIS — Z0000 Encounter for general adult medical examination without abnormal findings: Secondary | ICD-10-CM

## 2023-10-01 DIAGNOSIS — R7303 Prediabetes: Secondary | ICD-10-CM | POA: Diagnosis not present

## 2023-10-01 LAB — POCT URINALYSIS DIP (CLINITEK)
Bilirubin, UA: NEGATIVE
Blood, UA: NEGATIVE
Glucose, UA: NEGATIVE mg/dL
Leukocytes, UA: NEGATIVE
Nitrite, UA: NEGATIVE
Spec Grav, UA: >=1.03 — AB (ref 1.010–1.025)
Urobilinogen, UA: 0.2 U/dL
pH, UA: 6 (ref 5.0–8.0)

## 2023-10-01 NOTE — Progress Notes (Signed)
Subjective:  Patient ID: Marie Cox, female    DOB: 12-14-1981  Age: 42 y.o. MRN: [id number]  Chief Complaint  Patient presents with   Annual Exam    HPI Well Adult Physical: Patient here for a comprehensive physical exam.The patient reports  mild fatigue and facial flushing Do you take any herbs or supplements that were not prescribed by a doctor? no Are you taking calcium supplements? no Are you taking aspirin daily? yes  Encounter for general adult medical examination without abnormal findings  Physical ("At Risk" items are starred): Patient's last physical exam was 1 year ago .  Patient is not afflicted from Stress Incontinence and Urge Incontinence  Patient wears a seat belts Patient has smoke detectors and has carbon monoxide detectors. Patient practices appropriate gun safety. Patient wears sunscreen with extended sun exposure. Dental Care: brushes and flosses daily. Last dental visit: has dentures Vision impairments: wears glasses Ophthalmology/Optometry:is due Hearing loss: none  Patient's last menstrual period was 09/20/2023 (approximate).  Safe at home: Yes Self breast exams: Yes Last pap: unknown Last mammogram: 07/2023     10/01/2023   10:58 AM 06/17/2023    8:17 AM 08/21/2021   10:07 AM 08/24/2020   11:16 AM  Depression screen PHQ 2/9  Decreased Interest 0 0 0 0  Down, Depressed, Hopeless 0 0 0 0  PHQ - 2 Score 0 0 0 0  Altered sleeping 0 1    Tired, decreased energy 2 2    Change in appetite 0 0    Feeling bad or failure about yourself  0 0    Trouble concentrating 0 0    Moving slowly or fidgety/restless 0 0    Suicidal thoughts 0 0    PHQ-9 Score 2 3    Difficult doing work/chores Not difficult at all Not difficult at all           05/22/2021    9:50 AM 08/21/2021   10:07 AM 06/17/2023    8:17 AM 10/01/2023   10:58 AM  Fall Risk  Falls in the past year? 0 0 0 0  Was there an injury with Fall? 0 0 0 0  Fall Risk Category Calculator 0 0 0 0   Fall Risk Category (Retired) Low Low    (RETIRED) Patient Fall Risk Level Low fall risk Low fall risk    Patient at Risk for Falls Due to   No Fall Risks No Fall Risks  Fall risk Follow up Falls evaluation completed  Falls evaluation completed Falls evaluation completed             Social Hx   Social History   Socioeconomic History   Marital status: Media planner    Spouse name: Not on file   Number of children: 1   Years of education: Not on file   Highest education level: Not on file  Occupational History   Occupation: Forensic psychologist   Occupation: catering  Tobacco Use   Smoking status: Former    Types: Cigarettes    Start date: 04/09/2019   Smokeless tobacco: Never  Vaping Use   Vaping status: Never Used  Substance and Sexual Activity   Alcohol use: Never   Drug use: Never   Sexual activity: Not Currently    Partners: Male  Other Topics Concern   Not on file  Social History Narrative   Not on file   Social Determinants of Health   Financial Resource Strain: Low Risk  (10/01/2023)  Overall Financial Resource Strain (CARDIA)    Difficulty of Paying Living Expenses: Not hard at all  Food Insecurity: No Food Insecurity (10/01/2023)   Hunger Vital Sign    Worried About Running Out of Food in the Last Year: Never true    Ran Out of Food in the Last Year: Never true  Transportation Needs: No Transportation Needs (10/01/2023)   PRAPARE - Administrator, Civil Service (Medical): No    Lack of Transportation (Non-Medical): No  Physical Activity: Sufficiently Active (10/01/2023)   Exercise Vital Sign    Days of Exercise per Week: 5 days    Minutes of Exercise per Session: 60 min  Stress: No Stress Concern Present (10/01/2023)   [employer] of Occupational Health - Occupational Stress Questionnaire    Feeling of Stress : Not at all  Social Connections: Moderately Isolated (10/01/2023)   Social Connection and Isolation Panel [NHANES]    Frequency  of Communication with Friends and Family: More than three times a week    Frequency of Social Gatherings with Friends and Family: More than three times a week    Attends Religious Services: Never    Database administrator or Organizations: No    Attends Banker Meetings: Never    Marital Status: Living with partner   Past Medical History:  Diagnosis Date   Acute medial meniscal tear 10/02/2020   Agatston coronary artery calcium score less than 100 12/10/2019   Anxiety 10/04/2016   Atherosclerosis    Atypical chest pain 06/24/2019   Bilateral carpal tunnel syndrome 01/30/2021   Bursitis of shoulder, left 04/19/2021   Cellulitis 05/22/2021   Coronary artery calcification 12/10/2019   DDD (degenerative disc disease), lumbar 09/03/2016   Encounter for screening mammogram for malignant neoplasm of breast 08/24/2020   Essential hypertension 07/18/2016   Ex-smoker 12/10/2019   Frequent headaches 08/24/2020   Gastroesophageal reflux disease without esophagitis 07/18/2016   Hand numbness 01/30/2021   Incidental lung nodule 08/21/2021   Intractable migraine with aura without status migrainosus 07/18/2016   Menorrhagia with regular cycle 07/18/2016   Migraines    Mixed dyslipidemia 12/10/2019   Pulmonary nodules/lesions, multiple 11/23/2019   Past Surgical History:  Procedure Laterality Date   CARPAL TUNNEL RELEASE Right 07/02/2021   CARPAL TUNNEL RELEASE  12/14/2021   CHOLECYSTECTOMY  2005   TUBAL LIGATION  2006    Family History  Problem Relation Age of Onset   Diabetes Mother    Hypertension Mother    Colon polyps Mother    Irritable bowel syndrome Mother    Kidney disease Mother    Heart Problems Father    Heart disease Father    Heart attack Maternal Grandfather    Heart disease Maternal Grandfather    Heart attack Paternal Grandmother    Lung cancer Paternal Grandfather    Stomach cancer Neg Hx    Colon cancer Neg Hx     ROS CONSTITUTIONAL: see HPI E/N/T: Negative for  ear pain, nasal congestion and sore throat.  CARDIOVASCULAR: Negative for chest pain, dizziness, palpitations and pedal edema.  RESPIRATORY: Negative for recent cough and dyspnea.  GASTROINTESTINAL: Negative for abdominal pain, acid reflux symptoms, constipation, diarrhea, nausea and vomiting.  MSK: Negative for arthralgias and myalgias.  INTEGUMENTARY: Negative for rash.  NEUROLOGICAL: Negative for dizziness and headaches.  PSYCHIATRIC: Negative for sleep disturbance and to question depression screen.  Negative for depression, negative for anhedonia.   Objective:  PHYSICAL EXAM:   BP  110/80 (BP Location: Left Arm, Patient Position: Sitting, Cuff Size: Large)   Pulse 71   Temp (!) 97 F (36.1 C) (Temporal)   Ht 5' 7.25" (1.708 m)   Wt 242 lb (109.8 kg)   LMP 09/20/2023 (Approximate)   SpO2 97%   BMI 37.62 kg/m   Vision Screening   Right eye Left eye Both eyes  Without correction     With correction 20/20 20/20 20/20     GEN: Well nourished, well developed, in no acute distress  HEENT: normal external ears and nose - normal external auditory canals and TMS - hearing grossly normal - - Lips, Gums - normal  Oropharynx - normal mucosa, palate, and posterior pharynx Neck: no JVD or masses - no thyromegaly Cardiac: RRR; no murmurs, rubs, or gallops,no edema - no significant varicosities Respiratory:  normal respiratory rate and pattern with no distress - normal breath sounds with no rales, rhonchi, wheezes or rubs Breasts - pt defers GI: normal bowel sounds, no masses or tenderness GU - normal external genitalia - pap obtained - no abnormality of cervix - bimanual exam negative MS: no deformity or atrophy  Skin: warm and dry, no rash   Psych: euthymic mood, appropriate affect and demeanor  Office Visit on 10/01/2023  Component Date Value Ref Range Status   Color, UA 10/01/2023 straw (A)  yellow Final   Clarity, UA 10/01/2023 clear  clear Final   Glucose, UA 10/01/2023 negative   negative mg/dL Final   Bilirubin, UA 60/45/4098 negative  negative Final   Ketones, POC UA 10/01/2023 trace (5) (A)  negative mg/dL Final   Spec Grav, UA 11/91/4782 >=1.030 (A)  1.010 - 1.025 Final   Blood, UA 10/01/2023 negative  negative Final   pH, UA 10/01/2023 6.0  5.0 - 8.0 Final   POC PROTEIN,UA 10/01/2023 trace  negative, trace Final   Urobilinogen, UA 10/01/2023 0.2  0.2 or 1.0 E.U./dL Final   Nitrite, UA 95/62/1308 Negative  Negative Final   Leukocytes, UA 10/01/2023 Negative  Negative Final    Assessment & Plan:  Annual physical exam -     CBC with Differential/Platelet -     Comprehensive metabolic panel -     TSH -     Lipid panel -     Hemoglobin A1c -     POCT URINALYSIS DIP (CLINITEK) -     IGP, Aptima HPV, rfx 16/18,45  Prediabetes -     Hemoglobin A1c -     POCT URINALYSIS DIP (CLINITEK)    This is a list of the screening recommended for you and due dates:  Health Maintenance  Topic Date Due   Pap with HPV screening  Never done   Flu Shot  03/22/2024*   Mammogram  08/03/2024   DTaP/Tdap/Td vaccine (2 - Td or Tdap) 05/23/2031   HPV Vaccine  Aged Out   COVID-19 Vaccine  Discontinued   Hepatitis C Screening  Discontinued   HIV Screening  Discontinued  *Topic was postponed. The date shown is not the original due date.     Follow-up: Return in about 4 months (around 02/01/2024) for chronic fasting follow-up.  An After Visit Summary was printed and given to the patient.  Jettie Pagan [facility] [phone]

## 2023-10-02 LAB — CBC WITH DIFFERENTIAL/PLATELET
Basophils Absolute: 0 10*3/uL (ref 0.0–0.2)
Basos: 1 %
EOS (ABSOLUTE): 0.3 10*3/uL (ref 0.0–0.4)
Eos: 4 %
Hematocrit: 40.2 % (ref 34.0–46.6)
Hemoglobin: 12.9 g/dL (ref 11.1–15.9)
Immature Grans (Abs): 0 10*3/uL (ref 0.0–0.1)
Immature Granulocytes: 1 %
Lymphocytes Absolute: 0.6 10*3/uL — ABNORMAL LOW (ref 0.7–3.1)
Lymphs: 10 %
MCH: 27.8 pg (ref 26.6–33.0)
MCHC: 32.1 g/dL (ref 31.5–35.7)
MCV: 87 fL (ref 79–97)
Monocytes Absolute: 0.5 10*3/uL (ref 0.1–0.9)
Monocytes: 9 %
Neutrophils Absolute: 4.3 10*3/uL (ref 1.4–7.0)
Neutrophils: 75 %
Platelets: 372 10*3/uL (ref 150–450)
RBC: 4.64 x10E6/uL (ref 3.77–5.28)
RDW: 14.2 % (ref 11.7–15.4)
WBC: 5.7 10*3/uL (ref 3.4–10.8)

## 2023-10-02 LAB — COMPREHENSIVE METABOLIC PANEL
ALT: 12 [IU]/L (ref 0–32)
AST: 10 [IU]/L (ref 0–40)
Albumin: 4 g/dL (ref 3.9–4.9)
Alkaline Phosphatase: 104 [IU]/L (ref 44–121)
BUN/Creatinine Ratio: 12 (ref 9–23)
BUN: 10 mg/dL (ref 6–24)
Bilirubin Total: 0.8 mg/dL (ref 0.0–1.2)
CO2: 19 mmol/L — ABNORMAL LOW (ref 20–29)
Calcium: 9.3 mg/dL (ref 8.7–10.2)
Chloride: 105 mmol/L (ref 96–106)
Creatinine, Ser: 0.82 mg/dL (ref 0.57–1.00)
Globulin, Total: 2.7 g/dL (ref 1.5–4.5)
Glucose: 97 mg/dL (ref 70–99)
Potassium: 4.4 mmol/L (ref 3.5–5.2)
Sodium: 141 mmol/L (ref 134–144)
Total Protein: 6.7 g/dL (ref 6.0–8.5)
eGFR: 90 mL/min/{1.73_m2} (ref >59)

## 2023-10-02 LAB — HEMOGLOBIN A1C
Est. average glucose Bld gHb Est-mCnc: 120 mg/dL
Hgb A1c MFr Bld: 5.8 % — ABNORMAL HIGH (ref 4.8–5.6)

## 2023-10-02 LAB — LIPID PANEL
Chol/HDL Ratio: 3.1 {ratio} (ref 0.0–4.4)
Cholesterol, Total: 132 mg/dL (ref 100–199)
HDL: 43 mg/dL (ref >39)
LDL Chol Calc (NIH): 64 mg/dL (ref 0–99)
Triglycerides: 141 mg/dL (ref 0–149)
VLDL Cholesterol Cal: 25 mg/dL (ref 5–40)

## 2023-10-02 LAB — TSH: TSH: 2.8 u[IU]/mL (ref 0.450–4.500)

## 2023-10-05 LAB — IGP, APTIMA HPV, RFX 16/18,45: PAP Smear Comment: 0

## 2023-10-07 ENCOUNTER — Ambulatory Visit (INDEPENDENT_AMBULATORY_CARE_PROVIDER_SITE_OTHER): Payer: Commercial Managed Care - HMO | Admitting: Podiatry

## 2023-10-07 DIAGNOSIS — Z91199 Patient's noncompliance with other medical treatment and regimen due to unspecified reason: Secondary | ICD-10-CM

## 2023-10-07 NOTE — Progress Notes (Signed)
Patient absent for apointment

## 2023-10-14 ENCOUNTER — Ambulatory Visit (INDEPENDENT_AMBULATORY_CARE_PROVIDER_SITE_OTHER): Payer: Managed Care, Other (non HMO) | Admitting: Podiatry

## 2023-10-14 DIAGNOSIS — M2041 Other hammer toe(s) (acquired), right foot: Secondary | ICD-10-CM | POA: Diagnosis not present

## 2023-10-14 DIAGNOSIS — L600 Ingrowing nail: Secondary | ICD-10-CM

## 2023-10-15 ENCOUNTER — Encounter: Payer: Self-pay | Admitting: Podiatry

## 2023-10-15 NOTE — Progress Notes (Signed)
Status post right hallux nail avulsion, temporary on 09/22/2023   HPI: 42 y.o. female presents today for nail recheck of right hallux total nail avulsion site.  This was done due to nail deformity and onychomycosis.  Patient is doing well.  She has been soaking and dressing the toe in accordance with instructions.  She denies any pain or complications to the right first toe.  She denies any nausea, vomiting fever, chills, chest pain, shortness of breath.  Past Medical History:  Diagnosis Date   Acute medial meniscal tear 10/02/2020   Agatston coronary artery calcium score less than 100 12/10/2019   Anxiety 10/04/2016   Atherosclerosis    Atypical chest pain 06/24/2019   Bilateral carpal tunnel syndrome 01/30/2021   Bursitis of shoulder, left 04/19/2021   Cellulitis 05/22/2021   Coronary artery calcification 12/10/2019   DDD (degenerative disc disease), lumbar 09/03/2016   Encounter for screening mammogram for malignant neoplasm of breast 08/24/2020   Essential hypertension 07/18/2016   Ex-smoker 12/10/2019   Frequent headaches 08/24/2020   Gastroesophageal reflux disease without esophagitis 07/18/2016   Hand numbness 01/30/2021   Incidental lung nodule 08/21/2021   Intractable migraine with aura without status migrainosus 07/18/2016   Menorrhagia with regular cycle 07/18/2016   Migraines    Mixed dyslipidemia 12/10/2019   Pulmonary nodules/lesions, multiple 11/23/2019    Past Surgical History:  Procedure Laterality Date   CARPAL TUNNEL RELEASE Right 07/02/2021   CARPAL TUNNEL RELEASE  12/14/2021   CHOLECYSTECTOMY  2005   TUBAL LIGATION  2006    Allergies  Allergen Reactions   Shellfish Allergy Anaphylaxis, Itching, Nausea And Vomiting and Shortness Of Breath   Clindamycin Hives   Morphine And Codeine     ROS -as stated in HPI   Physical Exam:  General: The patient is alert and oriented x3 in no acute distress.  Dermatology: Right hallux nail avulsion site is well-healed.  No  surrounding erythema, edema, drainage appreciated.  Of note there is deroofed blister to the contracted adjacent second toe where the patient reports rubbing with the first toe.  This is wet without signs of acute bacterial infection.  Vascular: Palpable pedal pulses bilaterally. Capillary refill within normal limits.  No appreciable edema.  No erythema or calor.  Neurological: Light touch sensation grossly intact bilateral feet.   Musculoskeletal Exam: Right foot reducible second toe hammertoe contracture with valgus drift of the first toe    Assessment/Plan of Care: 1. Ingrown nail of great toe of right foot   2. Hammertoe of second toe of right foot      No orders of the defined types were placed in this encounter.  None  Discussed clinical findings with patient today.  # Right hallux nail avulsion site -This is well-healed at this time. -Patient states that she will be on her feet for work a lot over the upcoming month or 2 for pressure marked.  I advised the patient to continue wearing bandage as needed during this time to protect the site. -She is otherwise healed without incident.  # Second toe irritation due to hammertoe, valgus drift of first toe -First interspace toe spacer dispensed along with gel For the right second toe to prevent rubbing when shoes.  Follow-up as needed.   Arelia Volpe L. Marchia Bond, AACFAS [facility]     [address]  [place], [place] [address]                Office [phone]  Fax [phone]

## 2023-10-22 ENCOUNTER — Other Ambulatory Visit: Payer: Self-pay | Admitting: Physician Assistant

## 2023-10-22 ENCOUNTER — Other Ambulatory Visit: Payer: Self-pay

## 2023-10-22 ENCOUNTER — Other Ambulatory Visit: Payer: Self-pay | Admitting: Cardiology

## 2023-10-22 DIAGNOSIS — I10 Essential (primary) hypertension: Secondary | ICD-10-CM

## 2023-11-09 ENCOUNTER — Other Ambulatory Visit: Payer: Self-pay

## 2023-11-09 DIAGNOSIS — I10 Essential (primary) hypertension: Secondary | ICD-10-CM

## 2023-11-10 MED ORDER — LISINOPRIL 10 MG PO TABS: 10.0000 mg | ORAL_TABLET | Freq: Every day | ORAL | 1 refills | Status: DC

## 2023-11-14 ENCOUNTER — Other Ambulatory Visit: Payer: Self-pay | Admitting: Physician Assistant

## 2023-11-14 ENCOUNTER — Telehealth: Payer: Self-pay

## 2023-11-14 DIAGNOSIS — K219 Gastro-esophageal reflux disease without esophagitis: Secondary | ICD-10-CM

## 2023-11-14 NOTE — Telephone Encounter (Signed)
Message sent via my chart

## 2023-11-14 NOTE — Telephone Encounter (Signed)
Copied from CRM [id number]. Topic: Clinical - Medical Advice >> Nov 14, 2023  9:06 AM Larwance Sachs wrote: Reason for CRM: Patient called to ask Huntley Dec or nurse to reach back out regarding if she can take cetirizine (ZYRTEC) 10 MG tablet and pantoprazole (PROTONIX) 40 MG tablet twice a day, instead of once a day due to sinus and heartburn getting progressively worse   Call back at [phone]

## 2023-11-14 NOTE — Telephone Encounter (Signed)
No zyrtec is not recommend for twice daily She can take protonix twice daily but if symptoms worsening recommend to also follow up with GI - who would she like to see?

## 2023-12-31 ENCOUNTER — Other Ambulatory Visit: Payer: Self-pay | Admitting: Physician Assistant

## 2023-12-31 DIAGNOSIS — K219 Gastro-esophageal reflux disease without esophagitis: Secondary | ICD-10-CM

## 2023-12-31 MED ORDER — PANTOPRAZOLE SODIUM 40 MG PO TBEC
40.0000 mg | DELAYED_RELEASE_TABLET | Freq: Every day | ORAL | 0 refills | Status: DC
Start: 2023-12-31 — End: 2024-04-02

## 2024-02-03 ENCOUNTER — Encounter: Payer: Self-pay | Admitting: Physician Assistant

## 2024-02-03 ENCOUNTER — Ambulatory Visit: Payer: Commercial Managed Care - HMO | Admitting: Physician Assistant

## 2024-02-03 VITALS — BP 100/70 | HR 74 | Temp 97.2°F | Resp 18 | Ht 67.25 in | Wt 224.2 lb

## 2024-02-03 DIAGNOSIS — R7303 Prediabetes: Secondary | ICD-10-CM | POA: Diagnosis not present

## 2024-02-03 DIAGNOSIS — K219 Gastro-esophageal reflux disease without esophagitis: Secondary | ICD-10-CM

## 2024-02-03 DIAGNOSIS — E782 Mixed hyperlipidemia: Secondary | ICD-10-CM | POA: Diagnosis not present

## 2024-02-03 DIAGNOSIS — Z1231 Encounter for screening mammogram for malignant neoplasm of breast: Secondary | ICD-10-CM

## 2024-02-03 DIAGNOSIS — I10 Essential (primary) hypertension: Secondary | ICD-10-CM | POA: Diagnosis not present

## 2024-02-03 DIAGNOSIS — L719 Rosacea, unspecified: Secondary | ICD-10-CM | POA: Insufficient documentation

## 2024-02-03 MED ORDER — METRONIDAZOLE 0.75 % EX GEL: 1.0000 | Freq: Two times a day (BID) | CUTANEOUS | 1 refills | Status: DC

## 2024-02-03 NOTE — Addendum Note (Signed)
Addended by: Marianne Sofia on: 02/03/2024 10:26 AM   Modules accepted: Orders

## 2024-02-03 NOTE — Progress Notes (Addendum)
Subjective:  Patient ID: Marie Cox, female    DOB: Jun 12, 1981  Age: 43 y.o. MRN: [id number]  Chief Complaint  Patient presents with   Medical Management of Chronic Issues    HPI Pt presents for follow up of hypertension.  The patient is tolerating the medication well without side effects. Compliance with treatment has been good; including taking medication as directed , maintains a healthy diet and regular exercise regimen , and following up as directed.  Pt currently on zestril 10mg   Denies chest pain/sob/edema  Mixed hyperlipidemia  Pt presents with hyperlipidemia.  Compliance with treatment has been good  The patient is compliant with medications, maintains a low cholesterol diet , follows up as directed , and maintains an exercise regimen . The patient denies experiencing any hypercholesterolemia related symptoms.  Pt taking lipitor 20mg  qd  Pt with GERD - stable on protonix 40mg  qd  Pt with chronic back pain - [facility] - has DJD and herniated disc - has received spinal injections - for these issues she is taking voltaren 50mg  qd and uses voltaren 1% gel, zanaflex and tramadol  Pt with prediabetes - due to check labwork  Pt would like to schedule mammogram that is due in August  Pt complains of rash on face and redness of cheeks - would like to try medication    02/03/2024    9:55 AM 10/01/2023   10:58 AM 06/17/2023    8:17 AM 08/21/2021   10:07 AM 08/24/2020   11:16 AM  Depression screen PHQ 2/9  Decreased Interest 0 0 0 0 0  Down, Depressed, Hopeless 0 0 0 0 0  PHQ - 2 Score 0 0 0 0 0  Altered sleeping 1 0 1    Tired, decreased energy 1 2 2     Change in appetite 0 0 0    Feeling bad or failure about yourself  0 0 0    Trouble concentrating 0 0 0    Moving slowly or fidgety/restless 0 0 0    Suicidal thoughts  0 0    PHQ-9 Score 2 2 3     Difficult doing work/chores Not difficult at all Not difficult at all Not difficult  at all          05/22/2021    9:50 AM 08/21/2021   10:07 AM 06/17/2023    8:17 AM 10/01/2023   10:58 AM 02/03/2024    9:55 AM  Fall Risk  Falls in the past year? 0 0 0 0 0  Was there an injury with Fall? 0 0 0 0 0  Fall Risk Category Calculator 0 0 0 0 0  Fall Risk Category (Retired) Low Low     (RETIRED) Patient Fall Risk Level Low fall risk Low fall risk     Patient at Risk for Falls Due to   No Fall Risks No Fall Risks No Fall Risks  Fall risk Follow up Falls evaluation completed  Falls evaluation completed Falls evaluation completed Falls evaluation completed     ROS CONSTITUTIONAL: Negative for chills, fatigue, fever, unintentional weight gain and unintentional weight loss.  E/N/T: Negative for ear pain, nasal congestion and sore throat.  CARDIOVASCULAR: Negative for chest pain, dizziness, palpitations and pedal edema.  RESPIRATORY: Negative for recent cough and dyspnea.  GASTROINTESTINAL: Negative for abdominal pain, acid reflux symptoms, constipation, diarrhea, nausea and vomiting.  MSK: see HPI INTEGUMENTARY: Negative for rash.  NEUROLOGICAL: Negative for dizziness  and headaches.  PSYCHIATRIC: Negative for sleep disturbance and to question depression screen.  Negative for depression, negative for anhedonia.    Current Outpatient Medications:    aspirin EC 81 MG tablet, Take 81 mg by mouth daily., Disp: , Rfl:    cetirizine (ZYRTEC) 10 MG tablet, Take 1 tablet (10 mg total) by mouth daily., Disp: 30 tablet, Rfl: 11   diclofenac (VOLTAREN) 50 MG EC tablet, Take by mouth., Disp: , Rfl:    diclofenac Sodium (VOLTAREN) 1 % GEL, Apply 1 application topically daily., Disp: , Rfl:    lisinopril (ZESTRIL) 10 MG tablet, Take 1 tablet (10 mg total) by mouth daily., Disp: 90 tablet, Rfl: 1   pantoprazole (PROTONIX) 40 MG tablet, Take 1 tablet (40 mg total) by mouth daily., Disp: 90 tablet, Rfl: 0   tiZANidine (ZANAFLEX) 4 MG tablet, Take 4 mg by mouth every 6 (six) hours as needed for  muscle spasms., Disp: , Rfl:    traMADol (ULTRAM) 50 MG tablet, Take 50 mg by mouth 2 (two) times daily as needed., Disp: , Rfl:    atorvastatin (LIPITOR) 20 MG tablet, Take 1 tablet (20 mg total) by mouth daily., Disp: 90 tablet, Rfl: 1  Past Medical History:  Diagnosis Date   Acute medial meniscal tear 10/02/2020   Agatston coronary artery calcium score less than 100 12/10/2019   Anxiety 10/04/2016   Atherosclerosis    Atypical chest pain 06/24/2019   Bilateral carpal tunnel syndrome 01/30/2021   Bursitis of shoulder, left 04/19/2021   Cellulitis 05/22/2021   Coronary artery calcification 12/10/2019   DDD (degenerative disc disease), lumbar 09/03/2016   Encounter for screening mammogram for malignant neoplasm of breast 08/24/2020   Essential hypertension 07/18/2016   Ex-smoker 12/10/2019   Frequent headaches 08/24/2020   Gastroesophageal reflux disease without esophagitis 07/18/2016   Hand numbness 01/30/2021   Incidental lung nodule 08/21/2021   Intractable migraine with aura without status migrainosus 07/18/2016   Menorrhagia with regular cycle 07/18/2016   Migraines    Mixed dyslipidemia 12/10/2019   Pulmonary nodules/lesions, multiple 11/23/2019   Objective:  PHYSICAL EXAM:   BP 100/70 (BP Location: Right Arm, Patient Position: Sitting, Cuff Size: Large)   Pulse 74   Temp (!) 97.2 F (36.2 C) (Temporal)   Resp 18   Ht 5' 7.25" (1.708 m)   Wt 224 lb 3.2 oz (101.7 kg)   LMP 01/26/2024 (Approximate)   SpO2 96%   BMI 34.85 kg/m    GEN: Well nourished, well developed, in no acute distress   Cardiac: RRR; no murmurs, rubs, or gallops,no edema -  Respiratory:  normal respiratory rate and pattern with no distress - normal breath sounds with no rales, rhonchi, wheezes or rubs  MS: no deformity or atrophy  Skin: warm and dry, no rash  Neuro:  Alert and Oriented x 3, - CN II-Xii grossly intact Psych: euthymic mood, appropriate affect and demeanor  Assessment & Plan:    Prediabetes -      Comprehensive metabolic panel -     Hemoglobin A1c Watch diet Gastroesophageal reflux disease without esophagitis Continue protonix Essential hypertension -     CBC with Differential/Platelet -     Comprehensive metabolic panel -     TSH Continue zestril Mixed hyperlipidemia -     Comprehensive metabolic panel -     Lipid panel Continue lipitor Encounter for screening mammogram for breast cancer -     3D Screening Mammogram, Left and Right; Future Rosacea  Follow-up: Return in about 6 months (around 08/02/2024) for chronic fasting follow-up.  An After Visit Summary was printed and given to the patient.  [facility]ett[facility] [phone]

## 2024-02-04 ENCOUNTER — Encounter: Payer: Self-pay | Admitting: Physician Assistant

## 2024-02-04 LAB — LIPID PANEL
Chol/HDL Ratio: 3 {ratio} (ref 0.0–4.4)
Cholesterol, Total: 149 mg/dL (ref 100–199)
HDL: 49 mg/dL (ref >39)
LDL Chol Calc (NIH): 76 mg/dL (ref 0–99)
Triglycerides: 136 mg/dL (ref 0–149)
VLDL Cholesterol Cal: 24 mg/dL (ref 5–40)

## 2024-02-04 LAB — COMPREHENSIVE METABOLIC PANEL
ALT: 13 [IU]/L (ref 0–32)
AST: 11 [IU]/L (ref 0–40)
Albumin: 4 g/dL (ref 3.9–4.9)
Alkaline Phosphatase: 112 [IU]/L (ref 44–121)
BUN/Creatinine Ratio: 15 (ref 9–23)
BUN: 12 mg/dL (ref 6–24)
Bilirubin Total: 0.6 mg/dL (ref 0.0–1.2)
CO2: 20 mmol/L (ref 20–29)
Calcium: 9.3 mg/dL (ref 8.7–10.2)
Chloride: 104 mmol/L (ref 96–106)
Creatinine, Ser: 0.81 mg/dL (ref 0.57–1.00)
Globulin, Total: 2.6 g/dL (ref 1.5–4.5)
Glucose: 89 mg/dL (ref 70–99)
Potassium: 4.8 mmol/L (ref 3.5–5.2)
Sodium: 137 mmol/L (ref 134–144)
Total Protein: 6.6 g/dL (ref 6.0–8.5)
eGFR: 92 mL/min/{1.73_m2} (ref >59)

## 2024-02-04 LAB — CBC WITH DIFFERENTIAL/PLATELET
Basophils Absolute: 0.1 10*3/uL (ref 0.0–0.2)
Basos: 1 %
EOS (ABSOLUTE): 0.3 10*3/uL (ref 0.0–0.4)
Eos: 6 %
Hematocrit: 40.4 % (ref 34.0–46.6)
Hemoglobin: 12.8 g/dL (ref 11.1–15.9)
Immature Grans (Abs): 0 10*3/uL (ref 0.0–0.1)
Immature Granulocytes: 0 %
Lymphocytes Absolute: 0.5 10*3/uL — ABNORMAL LOW (ref 0.7–3.1)
Lymphs: 9 %
MCH: 26.6 pg (ref 26.6–33.0)
MCHC: 31.7 g/dL (ref 31.5–35.7)
MCV: 84 fL (ref 79–97)
Monocytes Absolute: 0.5 10*3/uL (ref 0.1–0.9)
Monocytes: 10 %
Neutrophils Absolute: 4 10*3/uL (ref 1.4–7.0)
Neutrophils: 74 %
Platelets: 388 10*3/uL (ref 150–450)
RBC: 4.81 x10E6/uL (ref 3.77–5.28)
RDW: 15 % (ref 11.7–15.4)
WBC: 5.4 10*3/uL (ref 3.4–10.8)

## 2024-02-04 LAB — HEMOGLOBIN A1C
Est. average glucose Bld gHb Est-mCnc: 117 mg/dL
Hgb A1c MFr Bld: 5.7 % — ABNORMAL HIGH (ref 4.8–5.6)

## 2024-02-04 LAB — TSH: TSH: 4.18 u[IU]/mL (ref 0.450–4.500)

## 2024-02-17 ENCOUNTER — Encounter: Payer: Self-pay | Admitting: Gastroenterology

## 2024-02-17 ENCOUNTER — Ambulatory Visit (INDEPENDENT_AMBULATORY_CARE_PROVIDER_SITE_OTHER): Payer: Commercial Managed Care - HMO | Admitting: Gastroenterology

## 2024-02-17 VITALS — BP 108/74 | HR 90 | Ht 69.0 in | Wt 225.2 lb

## 2024-02-17 DIAGNOSIS — K219 Gastro-esophageal reflux disease without esophagitis: Secondary | ICD-10-CM | POA: Diagnosis not present

## 2024-02-17 DIAGNOSIS — K5900 Constipation, unspecified: Secondary | ICD-10-CM | POA: Diagnosis not present

## 2024-02-17 DIAGNOSIS — K5909 Other constipation: Secondary | ICD-10-CM

## 2024-02-17 DIAGNOSIS — K449 Diaphragmatic hernia without obstruction or gangrene: Secondary | ICD-10-CM

## 2024-02-17 MED ORDER — LINACLOTIDE 72 MCG PO CAPS: 72.0000 ug | ORAL_CAPSULE | Freq: Every day | ORAL | 0 refills | Status: DC

## 2024-02-17 NOTE — Patient Instructions (Addendum)
 We have given you samples of the following medication to take: Linzess 72 mcg: Take 30 minutes before a meal  Linzess works best when taken once a day every day, on an empty stomach, at least 30 minutes before your first meal of the day.  When Linzess is taken daily as directed:  *Constipation relief is typically felt in about a week *IBS-C patients may begin to experience relief from belly pain and overall abdominal symptoms (pain, discomfort, and bloating) in about 1 week,   with symptoms typically improving over 12 weeks.  Diarrhea may occur in the first 2 weeks -keep taking it.  The diarrhea should go away and you should start having normal, complete, full bowel movements. It may be helpful to start treatment when you can be near the comfort of your own bathroom, such as a weekend.    __________________________________________________  Marie Cox have been scheduled for a follow up appointment on Tuesday, 4-29 at 10:20 am. Please arrive 10 minutes early for registration. If you need to reschedule or cancel this appointment please call [phone] as soon as possible. Thank you.   Thank you for trusting me with your gastrointestinal care!   Boone Master, PA   If your blood pressure at your visit was 140/90 or greater, please contact your primary care physician to follow up on this. ______________________________________________________  If you are age 39 or older, your body mass index should be between 23-30. Your Body mass index is 33.26 kg/m. If this is out of the aforementioned range listed, please consider follow up with your Primary Care Provider.  If you are age 4 or younger, your body mass index should be between 19-25. Your Body mass index is 33.26 kg/m. If this is out of the aformentioned range listed, please consider follow up with your Primary Care Provider.  ________________________________________________________  The [facility] providers would like to encourage you to  use [facility] to communicate with providers for non-urgent requests or questions.  Due to long hold times on the telephone, sending your provider a message by [facility] may be a faster and more efficient way to get a response.  Please allow 48 business hours for a response.  Please remember that this is for non-urgent requests.  _______________________________________________________  Due to recent changes in healthcare laws, you may see the results of your imaging and laboratory studies on MyChart before your provider has had a chance to review them.  We understand that in some cases there may be results that are confusing or concerning to you. Not all laboratory results come back in the same time frame and the provider may be waiting for multiple results in order to interpret others.  Please give [place] 48 hours in order for your provider to thoroughly review all the results before contacting the office for clarification of your results.

## 2024-02-17 NOTE — Progress Notes (Signed)
 Chief Complaint: Constipation and GERD Primary GI MD: Dr. Chales Abrahams  HPI: 43 year old female with a  past medical history of dyslipidemia, costochondritis, GERD, anxiety, hypertension, headaches, pulmonary nodules, cholecystectomy in 2005.   See PMH / PSH for additional history   Last seen in 2023 by Willette Cluster, NP.  At that time she was having GERD, regurgitation, LUQ pain, nausea, early satiety as well as chronic constipation.  She was put on PPI once daily and set up for EGD which was overall unrevealing and recommended to take MiraLAX daily  ---------------TODAY---------------  She experiences ongoing symptoms of gastroesophageal reflux disease (GERD) despite taking pantoprazole once daily. Her reflux symptoms persist, and she is considering increasing her dose to twice daily. A prior endoscopy was unremarkable except for a hiatal hernia, which may contribute to her reflux. She experiences discomfort when lying down, requiring positional adjustments to relieve pressure.  She consumes one Monster energy drink daily, which may exacerbate her symptoms due to its carbonation/caffeine. She avoids acidic foods but notes that certain meals, like sausage tortellini, can trigger her symptoms. She does not smoke and primarily drinks water.  She reports chronic constipation, characterized by 'little pebbles' and a sensation of blockage, which resolves after passing a hard stool. This condition causes discomfort, including 'backup pain,' hot sweats, and nausea until she has a bowel movement. She is currently using Miralax, but it is not providing sufficient relief. She experiences lower abdominal discomfort and has a history of sciatica, which complicates her symptoms.      PREVIOUS GI WORKUP   EGD 10/17/2022 - Small hiatal hernia - Normal EGD  Past Medical History:  Diagnosis Date   Acute medial meniscal tear 10/02/2020   Agatston coronary artery calcium score less than 100 12/10/2019    Anxiety 10/04/2016   Atherosclerosis    Atypical chest pain 06/24/2019   Bilateral carpal tunnel syndrome 01/30/2021   Bursitis of shoulder, left 04/19/2021   Cellulitis 05/22/2021   Coronary artery calcification 12/10/2019   DDD (degenerative disc disease), lumbar 09/03/2016   Encounter for screening mammogram for malignant neoplasm of breast 08/24/2020   Essential hypertension 07/18/2016   Ex-smoker 12/10/2019   Frequent headaches 08/24/2020   Gastroesophageal reflux disease without esophagitis 07/18/2016   Hand numbness 01/30/2021   Incidental lung nodule 08/21/2021   Intractable migraine with aura without status migrainosus 07/18/2016   Menorrhagia with regular cycle 07/18/2016   Migraines    Mixed dyslipidemia 12/10/2019   Pulmonary nodules/lesions, multiple 11/23/2019    Past Surgical History:  Procedure Laterality Date   CARPAL TUNNEL RELEASE Right 07/02/2021   CARPAL TUNNEL RELEASE  12/14/2021   CHOLECYSTECTOMY  2005   TUBAL LIGATION  2006    Current Outpatient Medications  Medication Sig Dispense Refill   aspirin EC 81 MG tablet Take 81 mg by mouth daily.     cetirizine (ZYRTEC) 10 MG tablet Take 1 tablet (10 mg total) by mouth daily. 30 tablet 11   diclofenac (VOLTAREN) 50 MG EC tablet Take by mouth.     diclofenac Sodium (VOLTAREN) 1 % GEL Apply 1 application topically daily.     linaclotide (LINZESS) 72 MCG capsule Take 1 capsule (72 mcg total) by mouth daily before breakfast. Take 30 minutes before a meal 8 capsule 0   lisinopril (ZESTRIL) 10 MG tablet Take 1 tablet (10 mg total) by mouth daily. 90 tablet 1   metroNIDAZOLE (METROGEL) 0.75 % gel Apply 1 Application topically 2 (two) times daily. 45 g 1  pantoprazole (PROTONIX) 40 MG tablet Take 1 tablet (40 mg total) by mouth daily. 90 tablet 0   tiZANidine (ZANAFLEX) 4 MG tablet Take 4 mg by mouth every 6 (six) hours as needed for muscle spasms.     traMADol (ULTRAM) 50 MG tablet Take 50 mg by mouth 2 (two) times daily as needed.      atorvastatin (LIPITOR) 20 MG tablet Take 1 tablet (20 mg total) by mouth daily. 90 tablet 1   No current facility-administered medications for this visit.    Allergies as of 02/17/2024 - Review Complete 02/17/2024  Allergen Reaction Noted   Shellfish allergy Anaphylaxis, Itching, Nausea And Vomiting, and Shortness Of Breath 10/30/2022   Clindamycin Hives 04/24/2020   Morphine and codeine  04/20/2019    Family History  Problem Relation Age of Onset   Diabetes Mother    Hypertension Mother    Colon polyps Mother    Irritable bowel syndrome Mother    Kidney disease Mother    Heart Problems Father    Heart disease Father    Heart attack Maternal Grandfather    Heart disease Maternal Grandfather    Heart attack Paternal Grandmother    Lung cancer Paternal Grandfather    Stomach cancer Neg Hx    Colon cancer Neg Hx     Social History   Socioeconomic History   Marital status: Media planner    Spouse name: Not on file   Number of children: 1   Years of education: Not on file   Highest education level: Not on file  Occupational History   Occupation: Forensic psychologist   Occupation: catering  Tobacco Use   Smoking status: Former    Types: Cigarettes    Start date: 04/09/2019   Smokeless tobacco: Never  Vaping Use   Vaping status: Never Used  Substance and Sexual Activity   Alcohol use: Never   Drug use: Never   Sexual activity: Not Currently    Partners: Male  Other Topics Concern   Not on file  Social History Narrative   Not on file   Social Drivers of Health   Financial Resource Strain: Low Risk  (10/01/2023)   Overall Financial Resource Strain (CARDIA)    Difficulty of Paying Living Expenses: Not hard at all  Food Insecurity: No Food Insecurity (10/01/2023)   Hunger Vital Sign    Worried About Running Out of Food in the Last Year: Never true    Ran Out of Food in the Last Year: Never true  Transportation Needs: No Transportation Needs (10/01/2023)    PRAPARE - Administrator, Civil Service (Medical): No    Lack of Transportation (Non-Medical): No  Physical Activity: Sufficiently Active (10/01/2023)   Exercise Vital Sign    Days of Exercise per Week: 5 days    Minutes of Exercise per Session: 60 min  Stress: No Stress Concern Present (10/01/2023)   [employer] of Occupational Health - Occupational Stress Questionnaire    Feeling of Stress : Not at all  Social Connections: Moderately Isolated (10/01/2023)   Social Connection and Isolation Panel [NHANES]    Frequency of Communication with Friends and Family: More than three times a week    Frequency of Social Gatherings with Friends and Family: More than three times a week    Attends Religious Services: Never    Database administrator or Organizations: No    Attends Banker Meetings: Never    Marital Status: Living with partner  Intimate Partner Violence: Not At Risk (10/01/2023)   Humiliation, Afraid, Rape, and Kick questionnaire    Fear of Current or Ex-Partner: No    Emotionally Abused: No    Physically Abused: No    Sexually Abused: No    Review of Systems:    Constitutional: No weight loss, fever, chills, weakness or fatigue HEENT: Eyes: No change in vision               Ears, Nose, Throat:  No change in hearing or congestion Skin: No rash or itching Cardiovascular: No chest pain, chest pressure or palpitations   Respiratory: No SOB or cough Gastrointestinal: See HPI and otherwise negative Genitourinary: No dysuria or change in urinary frequency Neurological: No headache, dizziness or syncope Musculoskeletal: No new muscle or joint pain Hematologic: No bleeding or bruising Psychiatric: No history of depression or anxiety    Physical Exam:  Vital signs: BP 108/74   Pulse 90   Ht 5\' 9"  (1.753 m)   Wt 225 lb 4 oz (102.2 kg)   LMP 01/26/2024 (Approximate)   BMI 33.26 kg/m   Constitutional: NAD, Well developed, Well nourished, alert and  cooperative Head:  Normocephalic and atraumatic. Eyes:   PEERL, EOMI. No icterus. Conjunctiva pink. Respiratory: Respirations even and unlabored. Lungs clear to auscultation bilaterally.   No wheezes, crackles, or rhonchi.  Cardiovascular:  Regular rate and rhythm. No peripheral edema, cyanosis or pallor.  Gastrointestinal:  Soft, nondistended, nontender. No rebound or guarding. Normal bowel sounds. No appreciable masses or hepatomegaly. Rectal:  Not performed.  Msk:  Symmetrical without gross deformities. Without edema, no deformity or joint abnormality.  Neurologic:  Alert and  oriented x4;  grossly normal neurologically.  Skin:   Dry and intact without significant lesions or rashes. Psychiatric: Oriented to person, place and time. Demonstrates good judgement and reason without abnormal affect or behaviors.  Physical Exam   ABDOMEN: Bowel sounds present, slightly decreased. Mild right-sided abdominal discomfort on palpation.       RELEVANT LABS AND IMAGING: CBC    Component Value Date/Time   WBC 5.4 02/03/2024 1026   RBC 4.81 02/03/2024 1026   HGB 12.8 02/03/2024 1026   HCT 40.4 02/03/2024 1026   PLT 388 02/03/2024 1026   MCV 84 02/03/2024 1026   MCH 26.6 02/03/2024 1026   MCHC 31.7 02/03/2024 1026   RDW 15.0 02/03/2024 1026   LYMPHSABS 0.5 (L) 02/03/2024 1026   EOSABS 0.3 02/03/2024 1026   BASOSABS 0.1 02/03/2024 1026    CMP     Component Value Date/Time   NA 137 02/03/2024 1026   K 4.8 02/03/2024 1026   CL 104 02/03/2024 1026   CO2 20 02/03/2024 1026   GLUCOSE 89 02/03/2024 1026   BUN 12 02/03/2024 1026   CREATININE 0.81 02/03/2024 1026   CALCIUM 9.3 02/03/2024 1026   PROT 6.6 02/03/2024 1026   ALBUMIN 4.0 02/03/2024 1026   AST 11 02/03/2024 1026   ALT 13 02/03/2024 1026   ALKPHOS 112 02/03/2024 1026   BILITOT 0.6 02/03/2024 1026   GFRNONAA 75 06/08/2020 0941   GFRAA 86 06/08/2020 0941     Assessment/Plan:      GERD Persistent symptoms despite Protonix  once daily. Hiatal hernia noted on previous endoscopy. Discussed lifestyle modifications including avoidance of caffeine and acidic foods, and use of a GERD bed wedge. --Increase Protonix to twice daily, 30 minutes before breakfast and dinner. -- educated patient on lifestyle modifications -- follow up 6-8 weeks  Constipation Hard, pebble-like stools with discomfort and nausea until bowel movement occurs. Inadequate response to Miralax. -Trial of Linzess ( ) provided as samples. -If ineffective, patient to contact clinic for further management.  General Health Maintenance / Followup Plans -Plan for colonoscopy screening at age 6. To be scheduled at follow-up visit in approximately 6 weeks.      Lara Mulch [facility] 02/17/2024, 12:43 PM  Cc: Marianne Sofia, PA-C

## 2024-03-01 ENCOUNTER — Telehealth: Payer: Self-pay | Admitting: Gastroenterology

## 2024-03-01 MED ORDER — LINACLOTIDE 72 MCG PO CAPS: 72.0000 ug | ORAL_CAPSULE | Freq: Every day | ORAL | 5 refills | Status: DC

## 2024-03-01 NOTE — Telephone Encounter (Signed)
 Refills sent to pharmacy for Linzess 72 mcg

## 2024-03-01 NOTE — Telephone Encounter (Signed)
 Patient called stated she would like a script for Linzess.

## 2024-03-18 ENCOUNTER — Other Ambulatory Visit: Payer: Self-pay

## 2024-03-18 MED ORDER — LINACLOTIDE 72 MCG PO CAPS: 72.0000 ug | ORAL_CAPSULE | Freq: Every day | ORAL | 5 refills | Status: DC

## 2024-04-02 ENCOUNTER — Ambulatory Visit: Admitting: Physician Assistant

## 2024-04-02 ENCOUNTER — Ambulatory Visit: Payer: Self-pay

## 2024-04-02 VITALS — BP 118/82 | HR 87 | Temp 98.1°F | Ht 69.0 in | Wt 224.0 lb

## 2024-04-02 DIAGNOSIS — K219 Gastro-esophageal reflux disease without esophagitis: Secondary | ICD-10-CM | POA: Diagnosis not present

## 2024-04-02 DIAGNOSIS — J029 Acute pharyngitis, unspecified: Secondary | ICD-10-CM | POA: Diagnosis not present

## 2024-04-02 DIAGNOSIS — J309 Allergic rhinitis, unspecified: Secondary | ICD-10-CM | POA: Insufficient documentation

## 2024-04-02 DIAGNOSIS — J301 Allergic rhinitis due to pollen: Secondary | ICD-10-CM | POA: Diagnosis not present

## 2024-04-02 LAB — POCT RAPID STREP A (OFFICE): Rapid Strep A Screen: NEGATIVE

## 2024-04-02 MED ORDER — PREDNISONE 20 MG PO TABS: ORAL_TABLET | ORAL | 0 refills | Status: AC

## 2024-04-02 MED ORDER — AMOXICILLIN 875 MG PO TABS: 875.0000 mg | ORAL_TABLET | Freq: Two times a day (BID) | ORAL | 0 refills | Status: AC

## 2024-04-02 MED ORDER — PANTOPRAZOLE SODIUM 40 MG PO TBEC: 40.0000 mg | DELAYED_RELEASE_TABLET | Freq: Every day | ORAL | 3 refills | Status: DC

## 2024-04-02 NOTE — Progress Notes (Signed)
 Acute Office Visit  Subjective:    Patient ID: Marie Cox, female    DOB: 05-09-1981, 43 y.o.   MRN: [id number]  Chief Complaint  Patient presents with   Sore Throat     HPI: Patient is in today for sore throat and right ear pain started yesterday evening.  Discussed the use of AI scribe software for clinical note transcription with the patient, who gave verbal consent to proceed.  History of Present Illness   The patient, with a history of seasonal allergies managed with daily Zyrtec, presents with throat pain described as 'swallowing glass.' The pain has been ongoing despite the Zyrtec, which had previously controlled her allergy symptoms. She also reports ear pain and discomfort when pulling on her ear. The patient has been on Zyrtec for years, having previously tried Claritin. She also has a history of reflux, managed with Protonix, and hypertension and hyperlipidemia, managed with lisinopril and atorvastatin, respectively.       Past Medical History:  Diagnosis Date   Acute medial meniscal tear 10/02/2020   Agatston coronary artery calcium score less than 100 12/10/2019   Anxiety 10/04/2016   Atherosclerosis    Atypical chest pain 06/24/2019   Bilateral carpal tunnel syndrome 01/30/2021   Bursitis of shoulder, left 04/19/2021   Cellulitis 05/22/2021   Coronary artery calcification 12/10/2019   DDD (degenerative disc disease), lumbar 09/03/2016   Encounter for screening mammogram for malignant neoplasm of breast 08/24/2020   Essential hypertension 07/18/2016   Ex-smoker 12/10/2019   Frequent headaches 08/24/2020   Gastroesophageal reflux disease without esophagitis 07/18/2016   Hand numbness 01/30/2021   Incidental lung nodule 08/21/2021   Intractable migraine with aura without status migrainosus 07/18/2016   Menorrhagia with regular cycle 07/18/2016   Migraines    Mixed dyslipidemia 12/10/2019   Pulmonary nodules/lesions, multiple 11/23/2019    Past Surgical History:   Procedure Laterality Date   CARPAL TUNNEL RELEASE Right 07/02/2021   CARPAL TUNNEL RELEASE  12/14/2021   CHOLECYSTECTOMY  2005   TUBAL LIGATION  2006    Family History  Problem Relation Age of Onset   Diabetes Mother    Hypertension Mother    Colon polyps Mother    Irritable bowel syndrome Mother    Kidney disease Mother    Heart Problems Father    Heart disease Father    Heart attack Maternal Grandfather    Heart disease Maternal Grandfather    Heart attack Paternal Grandmother    Lung cancer Paternal Grandfather    Stomach cancer Neg Hx    Colon cancer Neg Hx     Social History   Socioeconomic History   Marital status: Media planner    Spouse name: Not on file   Number of children: 1   Years of education: Not on file   Highest education level: Some college, no degree  Occupational History   Occupation: Forensic psychologist   Occupation: catering  Tobacco Use   Smoking status: Former    Types: Cigarettes    Start date: 04/09/2019   Smokeless tobacco: Never  Vaping Use   Vaping status: Never Used  Substance and Sexual Activity   Alcohol use: Never   Drug use: Never   Sexual activity: Not Currently    Partners: Male  Other Topics Concern   Not on file  Social History Narrative   Not on file   Social Drivers of Health   Financial Resource Strain: Low Risk  (04/02/2024)   Overall Financial Resource  Strain (CARDIA)    Difficulty of Paying Living Expenses: Not hard at all  Food Insecurity: No Food Insecurity (04/02/2024)   Hunger Vital Sign    Worried About Running Out of Food in the Last Year: Never true    Ran Out of Food in the Last Year: Never true  Transportation Needs: No Transportation Needs (04/02/2024)   PRAPARE - Administrator, Civil Service (Medical): No    Lack of Transportation (Non-Medical): No  Physical Activity: Insufficiently Active (04/02/2024)   Exercise Vital Sign    Days of Exercise per Week: 2 days    Minutes of Exercise per  Session: 30 min  Stress: No Stress Concern Present (04/02/2024)   [employer] of Occupational Health - Occupational Stress Questionnaire    Feeling of Stress : Not at all  Social Connections: Moderately Isolated (04/02/2024)   Social Connection and Isolation Panel [NHANES]    Frequency of Communication with Friends and Family: More than three times a week    Frequency of Social Gatherings with Friends and Family: More than three times a week    Attends Religious Services: Never    Database administrator or Organizations: No    Attends Banker Meetings: Never    Marital Status: Living with partner  Intimate Partner Violence: Not At Risk (10/01/2023)   Humiliation, Afraid, Rape, and Kick questionnaire    Fear of Current or Ex-Partner: No    Emotionally Abused: No    Physically Abused: No    Sexually Abused: No    Outpatient Medications Prior to Visit  Medication Sig Dispense Refill   aspirin EC 81 MG tablet Take 81 mg by mouth daily.     atorvastatin (LIPITOR) 20 MG tablet Take 1 tablet (20 mg total) by mouth daily. 90 tablet 1   cetirizine (ZYRTEC) 10 MG tablet Take 1 tablet (10 mg total) by mouth daily. 30 tablet 11   diclofenac (VOLTAREN) 50 MG EC tablet Take by mouth.     diclofenac Sodium (VOLTAREN) 1 % GEL Apply 1 application topically daily.     linaclotide (LINZESS) 72 MCG capsule Take 1 capsule (72 mcg total) by mouth daily before breakfast. Take 30 minutes before a meal 30 capsule 5   lisinopril (ZESTRIL) 10 MG tablet Take 1 tablet (10 mg total) by mouth daily. 90 tablet 1   metroNIDAZOLE (METROGEL) 0.75 % gel Apply 1 Application topically 2 (two) times daily. 45 g 1   tiZANidine (ZANAFLEX) 4 MG tablet Take 4 mg by mouth every 6 (six) hours as needed for muscle spasms.     traMADol (ULTRAM) 50 MG tablet Take 50 mg by mouth 2 (two) times daily as needed.     pantoprazole (PROTONIX) 40 MG tablet Take 1 tablet (40 mg total) by mouth daily. 90 tablet 0   No  facility-administered medications prior to visit.    Allergies  Allergen Reactions   Shellfish Allergy Anaphylaxis, Itching, Nausea And Vomiting and Shortness Of Breath   Clindamycin Hives   Morphine And Codeine     Review of Systems  Constitutional:  Negative for chills, fatigue and fever.  HENT:  Positive for sore throat. Negative for congestion, ear pain, postnasal drip, rhinorrhea, sinus pressure and sinus pain.   Respiratory:  Positive for cough. Negative for shortness of breath.   Cardiovascular:  Negative for chest pain.  Gastrointestinal:  Negative for diarrhea and nausea.  Musculoskeletal:  Negative for neck pain.  Neurological:  Negative for  dizziness and headaches.       Objective:        04/02/2024   10:33 AM 02/17/2024   10:10 AM 02/03/2024    9:52 AM  Vitals with BMI  Height 5\' 9"  5\' 9"  5' 7.25"  Weight 224 lbs 225 lbs 4 oz 224 lbs 3 oz  BMI 33.06 33.25 34.86  Systolic 118 108 409  Diastolic 82 74 70  Pulse 87 90 74    No data found.   Physical Exam Vitals reviewed.  Constitutional:      Appearance: Normal appearance.  HENT:     Right Ear: Hearing and ear canal normal. Swelling and tenderness present. A middle ear effusion is present. Tympanic membrane is erythematous. Tympanic membrane is not injected.     Left Ear: Hearing, tympanic membrane and ear canal normal.     Mouth/Throat:     Mouth: Mucous membranes are moist.     Tongue: No lesions.     Palate: No mass.     Pharynx: Oropharynx is clear. Posterior oropharyngeal erythema and postnasal drip present.  Neck:     Vascular: No carotid bruit.  Cardiovascular:     Rate and Rhythm: Normal rate and regular rhythm.     Heart sounds: Normal heart sounds.  Pulmonary:     Effort: Pulmonary effort is normal.     Breath sounds: Normal breath sounds.  Abdominal:     General: Bowel sounds are normal.     Palpations: Abdomen is soft.     Tenderness: There is no abdominal tenderness.  Neurological:      Mental Status: She is alert and oriented to person, place, and time.  Psychiatric:        Mood and Affect: Mood normal.        Behavior: Behavior normal.     There are no preventive care reminders to display for this patient.  There are no preventive care reminders to display for this patient.   Lab Results  Component Value Date   TSH 4.180 02/03/2024   Lab Results  Component Value Date   WBC 5.4 02/03/2024   HGB 12.8 02/03/2024   HCT 40.4 02/03/2024   MCV 84 02/03/2024   PLT 388 02/03/2024   Lab Results  Component Value Date   NA 137 02/03/2024   K 4.8 02/03/2024   CO2 20 02/03/2024   GLUCOSE 89 02/03/2024   BUN 12 02/03/2024   CREATININE 0.81 02/03/2024   BILITOT 0.6 02/03/2024   ALKPHOS 112 02/03/2024   AST 11 02/03/2024   ALT 13 02/03/2024   PROT 6.6 02/03/2024   ALBUMIN 4.0 02/03/2024   CALCIUM 9.3 02/03/2024   EGFR 92 02/03/2024   Lab Results  Component Value Date   CHOL 149 02/03/2024   Lab Results  Component Value Date   HDL 49 02/03/2024   Lab Results  Component Value Date   LDLCALC 76 02/03/2024   Lab Results  Component Value Date   TRIG 136 02/03/2024   Lab Results  Component Value Date   CHOLHDL 3.0 02/03/2024   Lab Results  Component Value Date   HGBA1C 5.7 (H) 02/03/2024       Assessment & Plan:  Sore throat Assessment & Plan: Rapid strep test negative. Throat redness suggests possible early streptococcal infection. Preemptive treatment preferred to prevent progression. - Prescribe prednisone taper to reduce inflammation and facilitate drainage. - Prepare amoxicillin prescription if symptoms worsen or do not improve with prednisone. - Advise non-menthol  lozenges or hard candy to increase salivation and soothe throat. - Encourage increased fluid intake, including options with electrolytes like Gatorade, Pedialyte, or Liquid IV.  Orders: -     POCT rapid strep A -     predniSONE; Take 3 tablets (60 mg total) by mouth daily  with breakfast for 3 days, THEN 2 tablets (40 mg total) daily with breakfast for 3 days, THEN 1 tablet (20 mg total) daily with breakfast for 3 days.  Dispense: 18 tablet; Refill: 0 -     Amoxicillin; Take 1 tablet (875 mg total) by mouth 2 (two) times daily for 7 days.  Dispense: 14 tablet; Refill: 0  Gastroesophageal reflux disease without esophagitis Assessment & Plan: Reflux symptoms improved. Pantoprazole effective. - Refill pantoprazole prescription with refills through [facility].  Orders: -     Pantoprazole Sodium; Take 1 tablet (40 mg total) by mouth daily.  Dispense: 90 tablet; Refill: 3  Seasonal allergic rhinitis due to pollen Assessment & Plan: Seasonal allergies managed with Zyrtec. Fluid in ear and redness suggest allergic rhinitis. - Continue Zyrtec for allergy management. - Consider switching antihistamines if Zyrtec becomes less effective.        Meds ordered this encounter  Medications   predniSONE (DELTASONE) 20 MG tablet    Sig: Take 3 tablets (60 mg total) by mouth daily with breakfast for 3 days, THEN 2 tablets (40 mg total) daily with breakfast for 3 days, THEN 1 tablet (20 mg total) daily with breakfast for 3 days.    Dispense:  18 tablet    Refill:  0   amoxicillin (AMOXIL) 875 MG tablet    Sig: Take 1 tablet (875 mg total) by mouth 2 (two) times daily for 7 days.    Dispense:  14 tablet    Refill:  0   pantoprazole (PROTONIX) 40 MG tablet    Sig: Take 1 tablet (40 mg total) by mouth daily.    Dispense:  90 tablet    Refill:  3    Orders Placed This Encounter  Procedures   POCT rapid strep A     Follow-up - Monitor symptoms and report if she develops into a productive cough.       Follow-up: Return if symptoms worsen or fail to improve.  An After Visit Summary was printed and given to the patient.  Langley Gauss, [facility] [phone]

## 2024-04-02 NOTE — Patient Instructions (Signed)
 VISIT SUMMARY:  You came in today with a sore throat that you described as feeling like 'swallowing glass,' along with ear pain and discomfort. We discussed your history of seasonal allergies, reflux, hypertension, and hyperlipidemia. A rapid strep test was negative, but we are treating you for a possible early streptococcal infection. We also reviewed your current medications and allergies.  YOUR PLAN:  -SORE THROAT WITH POSSIBLE EARLY STREPTOCOCCAL INFECTION: Your sore throat may be an early streptococcal infection, which is a bacterial infection that can cause throat pain and redness. Although your rapid strep test was negative, we are starting you on a prednisone taper to reduce inflammation and help with drainage. If your symptoms worsen or do not improve, we have prepared an amoxicillin prescription for you. Additionally, use non-menthol lozenges or hard candy to soothe your throat and increase your fluid intake with options like Gatorade, Pedialyte, or Liquid IV.  -ALLERGIC RHINITIS: Allergic rhinitis is inflammation of the nasal passages caused by allergies. Your seasonal allergies are currently managed with Zyrtec, but you have fluid in your ear and redness, which suggests allergic rhinitis. Continue taking Zyrtec, and if it becomes less effective, we may consider switching to a different antihistamine.  -GASTROESOPHAGEAL REFLUX DISEASE (GERD): GERD is a condition where stomach acid frequently flows back into the tube connecting your mouth and stomach, causing irritation. Your reflux symptoms have improved with pantoprazole, and we will refill your prescription through [facility].  -MEDICATION ALLERGIES: You have allergies to clindamycin, morphine, and shellfish. We discussed the potential cross-reactivity between morphine and codeine. Although you do not believe you are allergic to codeine, there is a possibility of cross-reactivity due to their similar chemical  structures.  INSTRUCTIONS:  Please monitor your symptoms and report if you develop a productive cough. If your sore throat symptoms worsen or do not improve with prednisone, start the amoxicillin prescription. Continue taking your current medications as prescribed, and follow up with any changes in your allergy symptoms.

## 2024-04-02 NOTE — Assessment & Plan Note (Signed)
 Seasonal allergies managed with Zyrtec. Fluid in ear and redness suggest allergic rhinitis. - Continue Zyrtec for allergy management. - Consider switching antihistamines if Zyrtec becomes less effective.

## 2024-04-02 NOTE — Assessment & Plan Note (Signed)
 Rapid strep test negative. Throat redness suggests possible early streptococcal infection. Preemptive treatment preferred to prevent progression. - Prescribe prednisone taper to reduce inflammation and facilitate drainage. - Prepare amoxicillin prescription if symptoms worsen or do not improve with prednisone. - Advise non-menthol lozenges or hard candy to increase salivation and soothe throat. - Encourage increased fluid intake, including options with electrolytes like Gatorade, Pedialyte, or Liquid IV.

## 2024-04-02 NOTE — Telephone Encounter (Signed)
 Copied from CRM [id number]. Topic: Clinical - Red Word Triage >> Apr 02, 2024  8:55 AM Everette C wrote: [facility] that prompted transfer to Nurse Triage: The patient has experienced throat swelling since yesterday afternoon 04/01/24   Chief Complaint: Sore throat/Ear Pain Symptoms: pain Frequency: since yesterday (1 day) Pertinent Negatives: Patient denies chest pain, difficulty breathing, fever,  Disposition: [] ED /[] Urgent Care (no appt availability in office) / [x] Appointment(In office/virtual)/ []  [facility]/ [] Home Care/ [] Refused Recommended Disposition /[]  Mobile Bus/ []  Follow-up with PCP Additional Notes: Patient called and advised that her throat has been hurting since yesterday.  Her right ear is also hurting and her voice is hoarse. Patient states that her throat feels like she is "swallowing pieces of glass". She hasn't been around anyone diagnosed with strep throat that she is aware of. Patient denies any chest pain, difficulty breathing, or a fever at this time. She also denies coming into contact with any potential things that she could have any type of allergic reaction to. Patient appointment is made for today 04/02/2024 with Dr Blane Ohara at 10:40 am. Patient is given Care Advice as per protocol. Patient is also advised that if anything worsens to go to the Emergency Room. Patient verbalized understanding.  Reason for Disposition  SEVERE (e.g., excruciating) throat pain  Answer Assessment - Initial Assessment Questions 1. ONSET: "When did the throat start hurting?" (Hours or days ago)      Since yesterday 2. SEVERITY: "How bad is the sore throat?" (Scale 1-10; mild, moderate or severe)   - MILD (1-3):  Doesn't interfere with eating or normal activities.   - MODERATE (4-7): Interferes with eating some solids and normal activities.   - SEVERE (8-10):  Excruciating pain, interferes with most normal activities.   - SEVERE WITH DYSPHAGIA (10): Can't  swallow liquids, drooling.     9 3. STREP EXPOSURE: "Has there been any exposure to strep within the past week?" If Yes, ask: "What type of contact occurred?"      Patient denies 4.  VIRAL SYMPTOMS: "Are there any symptoms of a cold, such as a runny nose, cough, hoarse voice or red eyes?"      Hoarse voice and right ear pain 5. FEVER: "Do you have a fever?" If Yes, ask: "What is your temperature, how was it measured, and when did it start?"     No 6. PUS ON THE TONSILS: "Is there pus on the tonsils in the back of your throat?"     N/A 7. OTHER SYMPTOMS: "Do you have any other symptoms?" (e.g., difficulty breathing, headache, rash)     Hoarse voice and right ear pain 8. PREGNANCY: "Is there any chance you are pregnant?" "When was your last menstrual period?"     No--last period just ended  Protocols used: Sore Throat-A-AH

## 2024-04-02 NOTE — Assessment & Plan Note (Signed)
 Reflux symptoms improved. Pantoprazole effective. - Refill pantoprazole prescription with refills through [facility].

## 2024-04-06 ENCOUNTER — Encounter: Payer: Self-pay | Admitting: Physician Assistant

## 2024-04-13 ENCOUNTER — Ambulatory Visit: Payer: Commercial Managed Care - HMO | Admitting: Gastroenterology

## 2024-04-14 ENCOUNTER — Other Ambulatory Visit: Payer: Self-pay | Admitting: Cardiology

## 2024-04-14 NOTE — Telephone Encounter (Signed)
 Prescription sent to pharmacy.

## 2024-04-19 NOTE — Progress Notes (Signed)
 "  Chief Complaint: Follow-up Primary GI MD: Dr. Charlanne  HPI: 43 year old female with a  past medical history of dyslipidemia, costochondritis, GERD, anxiety, hypertension, headaches, pulmonary nodules, cholecystectomy in 2005.   See PMH / PSH for additional history   Discussed the use of AI scribe software for clinical note transcription with the patient, who gave verbal consent to proceed.  History of Present Illness  She has been experiencing significant symptoms of gastroesophageal reflux disease (GERD). Although previously advised to increase her proton pump inhibitor (PPI) to twice daily, she currently takes it once daily due to forgetfulness. Despite this, her reflux symptoms have improved, which she attributes to the resolution of constipation.  For constipation, she was started on Linzess , which has significantly improved her bowel movements. Her stools are no longer 'pebble-like' and she experiences no abdominal pain. Her insurance covers most of the cost, and she pays approximately thirty dollars a month for the medication.  She has a family history of polyps, as her mother has had polyps removed, and her aunt has a history of colon cancer.    PREVIOUS GI WORKUP   EGD 10/17/2022 - Small hiatal hernia - Normal EGD  Past Medical History:  Diagnosis Date   Acute medial meniscal tear 10/02/2020   Agatston coronary artery calcium  score less than 100 12/10/2019   Anxiety 10/04/2016   Atherosclerosis    Atypical chest pain 06/24/2019   Bilateral carpal tunnel syndrome 01/30/2021   Bursitis of shoulder, left 04/19/2021   Cellulitis 05/22/2021   Coronary artery calcification 12/10/2019   DDD (degenerative disc disease), lumbar 09/03/2016   Encounter for screening mammogram for malignant neoplasm of breast 08/24/2020   Essential hypertension 07/18/2016   Ex-smoker 12/10/2019   Frequent headaches 08/24/2020   Gastroesophageal reflux disease without esophagitis 07/18/2016   Hand numbness  01/30/2021   Incidental lung nodule 08/21/2021   Intractable migraine with aura without status migrainosus 07/18/2016   Menorrhagia with regular cycle 07/18/2016   Migraines    Mixed dyslipidemia 12/10/2019   Pulmonary nodules/lesions, multiple 11/23/2019    Past Surgical History:  Procedure Laterality Date   CARPAL TUNNEL RELEASE Right 07/02/2021   CARPAL TUNNEL RELEASE  12/14/2021   CHOLECYSTECTOMY  2005   TUBAL LIGATION  2006    Current Outpatient Medications  Medication Sig Dispense Refill   aspirin EC 81 MG tablet Take 81 mg by mouth daily.     atorvastatin  (LIPITOR) 20 MG tablet TAKE 1 TABLET DAILY 90 tablet 2   diclofenac (VOLTAREN) 50 MG EC tablet Take by mouth.     diclofenac Sodium (VOLTAREN) 1 % GEL Apply 1 application topically daily.     levocetirizine (XYZAL ALLERGY 24HR) 5 MG tablet Take 5 mg by mouth daily.     linaclotide  (LINZESS ) 72 MCG capsule Take 1 capsule (72 mcg total) by mouth daily before breakfast. Take 30 minutes before a meal 30 capsule 5   lisinopril  (ZESTRIL ) 10 MG tablet Take 1 tablet (10 mg total) by mouth daily. 90 tablet 1   metroNIDAZOLE  (METROGEL ) 0.75 % gel Apply 1 Application topically 2 (two) times daily. 45 g 1   pantoprazole  (PROTONIX ) 40 MG tablet Take 1 tablet (40 mg total) by mouth daily. 90 tablet 3   tiZANidine (ZANAFLEX) 4 MG tablet Take 4 mg by mouth every 6 (six) hours as needed for muscle spasms.     traMADol (ULTRAM) 50 MG tablet Take 50 mg by mouth 2 (two) times daily as needed.  No current facility-administered medications for this visit.    Allergies as of 04/20/2024 - Review Complete 04/20/2024  Allergen Reaction Noted   Shellfish allergy Anaphylaxis, Itching, Nausea And Vomiting, and Shortness Of Breath 10/30/2022   Clindamycin Hives 04/24/2020   Morphine and codeine  04/20/2019    Family History  Problem Relation Age of Onset   Diabetes Mother    Hypertension Mother    Colon polyps Mother    Irritable bowel syndrome  Mother    Kidney disease Mother    Heart Problems Father    Heart disease Father    Heart attack Maternal Grandfather    Heart disease Maternal Grandfather    Heart attack Paternal Grandmother    Lung cancer Paternal Grandfather    Stomach cancer Neg Hx    Colon cancer Neg Hx     Social History   Socioeconomic History   Marital status: Media Planner    Spouse name: Not on file   Number of children: 1   Years of education: Not on file   Highest education level: Some college, no degree  Occupational History   Occupation: Forensic Psychologist   Occupation: catering  Tobacco Use   Smoking status: Former    Types: Cigarettes    Start date: 04/09/2019   Smokeless tobacco: Never  Vaping Use   Vaping status: Never Used  Substance and Sexual Activity   Alcohol use: Never   Drug use: Never   Sexual activity: Not Currently    Partners: Male  Other Topics Concern   Not on file  Social History Narrative   Not on file   Social Drivers of Health   Financial Resource Strain: Low Risk  (04/02/2024)   Overall Financial Resource Strain (CARDIA)    Difficulty of Paying Living Expenses: Not hard at all  Food Insecurity: No Food Insecurity (04/02/2024)   Hunger Vital Sign    Worried About Running Out of Food in the Last Year: Never true    Ran Out of Food in the Last Year: Never true  Transportation Needs: No Transportation Needs (04/02/2024)   PRAPARE - Administrator, Civil Service (Medical): No    Lack of Transportation (Non-Medical): No  Physical Activity: Insufficiently Active (04/02/2024)   Exercise Vital Sign    Days of Exercise per Week: 2 days    Minutes of Exercise per Session: 30 min  Stress: No Stress Concern Present (04/02/2024)   [employer] of Occupational Health - Occupational Stress Questionnaire    Feeling of Stress : Not at all  Social Connections: Moderately Isolated (04/02/2024)   Social Connection and Isolation Panel [NHANES]    Frequency of  Communication with Friends and Family: More than three times a week    Frequency of Social Gatherings with Friends and Family: More than three times a week    Attends Religious Services: Never    Database Administrator or Organizations: No    Attends Banker Meetings: Never    Marital Status: Living with partner  Intimate Partner Violence: Not At Risk (10/01/2023)   Humiliation, Afraid, Rape, and Kick questionnaire    Fear of Current or Ex-Partner: No    Emotionally Abused: No    Physically Abused: No    Sexually Abused: No    Review of Systems:    Constitutional: No weight loss, fever, chills, weakness or fatigue HEENT: Eyes: No change in vision  Ears, Nose, Throat:  No change in hearing or congestion Skin: No rash or itching Cardiovascular: No chest pain, chest pressure or palpitations   Respiratory: No SOB or cough Gastrointestinal: See HPI and otherwise negative Genitourinary: No dysuria or change in urinary frequency Neurological: No headache, dizziness or syncope Musculoskeletal: No new muscle or joint pain Hematologic: No bleeding or bruising Psychiatric: No history of depression or anxiety    Physical Exam:  Vital signs: BP 116/68 (BP Location: Left Arm, Patient Position: Sitting, Cuff Size: Large)   Pulse 68   Ht 5' 8 (1.727 m) Comment: height measured without shoes  Wt 232 lb 6 oz (105.4 kg)   BMI 35.33 kg/m   Constitutional: NAD, alert and cooperative Head:  Normocephalic and atraumatic. Eyes:   PEERL, EOMI. No icterus. Conjunctiva pink. Respiratory: Respirations even and unlabored. Lungs clear to auscultation bilaterally.   No wheezes, crackles, or rhonchi.  Cardiovascular:  Regular rate and rhythm. No peripheral edema, cyanosis or pallor.  Gastrointestinal:  Soft, nondistended, nontender. No rebound or guarding. Normal bowel sounds. No appreciable masses or hepatomegaly. Rectal:  Declines Msk:  Symmetrical without gross deformities.  Without edema, no deformity or joint abnormality.  Neurologic:  Alert and  oriented x4;  grossly normal neurologically.  Skin:   Dry and intact without significant lesions or rashes. Psychiatric: Oriented to person, place and time. Demonstrates good judgement and reason without abnormal affect or behaviors.   RELEVANT LABS AND IMAGING: CBC    Component Value Date/Time   WBC 5.4 02/03/2024 1026   RBC 4.81 02/03/2024 1026   HGB 12.8 02/03/2024 1026   HCT 40.4 02/03/2024 1026   PLT 388 02/03/2024 1026   MCV 84 02/03/2024 1026   MCH 26.6 02/03/2024 1026   MCHC 31.7 02/03/2024 1026   RDW 15.0 02/03/2024 1026   LYMPHSABS 0.5 (L) 02/03/2024 1026   EOSABS 0.3 02/03/2024 1026   BASOSABS 0.1 02/03/2024 1026    CMP     Component Value Date/Time   NA 137 02/03/2024 1026   K 4.8 02/03/2024 1026   CL 104 02/03/2024 1026   CO2 20 02/03/2024 1026   GLUCOSE 89 02/03/2024 1026   BUN 12 02/03/2024 1026   CREATININE 0.81 02/03/2024 1026   CALCIUM  9.3 02/03/2024 1026   PROT 6.6 02/03/2024 1026   ALBUMIN 4.0 02/03/2024 1026   AST 11 02/03/2024 1026   ALT 13 02/03/2024 1026   ALKPHOS 112 02/03/2024 1026   BILITOT 0.6 02/03/2024 1026   GFRNONAA 75 06/08/2020 0941   GFRAA 86 06/08/2020 0941     Assessment/Plan:   GERD Seen February 2025 with persistent symptoms despite PPI once daily.  Increase to twice daily and provided lifestyle modifications.  Hiatal hernia on recent endoscopy. Patient did not increase to twice daily PPI but noticed an improvement in GERD when constipation resolved. Currently well controlled on once daily PPI -- continue once daily PPI -- educated patient on lifestyle modifications  Constipation Hard, pebble-like stools with discomfort and nausea until bowel movement occurs. Inadequate response to Miralax.  Provided trial of Linzess  72 mcg at last appointment. Substantial improvement and resolution on linzess  . -- well controlled with Linzess  72mcg -- can  refill x 1 year  Colon cancer screening Due for initial screening colonoscopy based on age. No family history of colon cancer. Mother with polyps. -- schedule colonoscopy -- I thoroughly discussed the procedure with the patient (at bedside) to include nature of the procedure, alternatives, benefits, and risks (including but  not limited to bleeding, infection, perforation, anesthesia/cardiac pulmonary complications).  Patient verbalized understanding and gave verbal consent to proceed with procedure.    Nestor Mollie RIGGERS [facility] 04/20/2024, 10:31 AM  Cc: Nicholaus Credit, PA-C "

## 2024-04-20 ENCOUNTER — Encounter: Payer: Self-pay | Admitting: Gastroenterology

## 2024-04-20 ENCOUNTER — Ambulatory Visit: Payer: Commercial Managed Care - HMO | Admitting: Gastroenterology

## 2024-04-20 VITALS — BP 116/68 | HR 68 | Ht 68.0 in | Wt 232.4 lb

## 2024-04-20 DIAGNOSIS — Z1211 Encounter for screening for malignant neoplasm of colon: Secondary | ICD-10-CM

## 2024-04-20 DIAGNOSIS — K219 Gastro-esophageal reflux disease without esophagitis: Secondary | ICD-10-CM

## 2024-04-20 DIAGNOSIS — K449 Diaphragmatic hernia without obstruction or gangrene: Secondary | ICD-10-CM | POA: Diagnosis not present

## 2024-04-20 DIAGNOSIS — K5909 Other constipation: Secondary | ICD-10-CM | POA: Diagnosis not present

## 2024-04-20 MED ORDER — LINACLOTIDE 72 MCG PO CAPS: 72.0000 ug | ORAL_CAPSULE | Freq: Every day | ORAL | 11 refills | Status: AC

## 2024-04-20 MED ORDER — NA SULFATE-K SULFATE-MG SULF 17.5-3.13-1.6 GM/177ML PO SOLN
1.0000 | Freq: Once | ORAL | 0 refills | Status: AC
Start: 2024-04-20 — End: 2024-04-20

## 2024-04-20 NOTE — Patient Instructions (Addendum)
 We have sent the following medications to your pharmacy for you to pick up at your convenience: Linzess  72mcg daily before breakfast  You have been scheduled for a colonoscopy. Please follow written instructions given to you at your visit today.   If you use inhalers (even only as needed), please bring them with you on the day of your procedure.  DO NOT TAKE 7 DAYS PRIOR TO TEST- Trulicity (dulaglutide) Ozempic, Wegovy (semaglutide) Mounjaro (tirzepatide) Bydureon Bcise (exanatide extended release)  DO NOT TAKE 1 DAY PRIOR TO YOUR TEST Rybelsus (semaglutide) Adlyxin (lixisenatide) Victoza (liraglutide) Byetta (exanatide) ___________________________________________________________________________  Please follow up sooner if symptoms increase or worsen   Due to recent changes in healthcare laws, you may see the results of your imaging and laboratory studies on MyChart before your provider has had a chance to review them.  We understand that in some cases there may be results that are confusing or concerning to you. Not all laboratory results come back in the same time frame and the provider may be waiting for multiple results in order to interpret others.  Please give us  48 hours in order for your provider to thoroughly review all the results before contacting the office for clarification of your results.   _______________________________________________________  If your blood pressure at your visit was 140/90 or greater, please contact your primary care physician to follow up on this.  _______________________________________________________  If you are age 69 or older, your body mass index should be between 23-30. Your Body mass index is 35.33 kg/m. If this is out of the aforementioned range listed, please consider follow up with your Primary Care Provider.  If you are age 59 or younger, your body mass index should be between 19-25. Your Body mass index is 35.33 kg/m. If this is out  of the aformentioned range listed, please consider follow up with your Primary Care Provider.   ________________________________________________________  The [facility] providers would like to encourage you to use MYCHART to communicate with providers for non-urgent requests or questions.  Due to long hold times on the telephone, sending your provider a message by [facility] may be a faster and more efficient way to get a response.  Please allow 48 business hours for a response.  Please remember that this is for non-urgent requests.  _______________________________________________________ Thank you for trusting me with your gastrointestinal care!   Suzanna Erp, PA

## 2024-04-28 DIAGNOSIS — M47819 Spondylosis without myelopathy or radiculopathy, site unspecified: Secondary | ICD-10-CM | POA: Insufficient documentation

## 2024-05-10 ENCOUNTER — Other Ambulatory Visit: Payer: Self-pay | Admitting: Physician Assistant

## 2024-05-10 DIAGNOSIS — I10 Essential (primary) hypertension: Secondary | ICD-10-CM

## 2024-05-31 ENCOUNTER — Encounter: Payer: Self-pay | Admitting: Physician Assistant

## 2024-05-31 ENCOUNTER — Ambulatory Visit: Admitting: Physician Assistant

## 2024-05-31 VITALS — BP 122/80 | HR 81 | Temp 98.6°F | Resp 18 | Ht 68.0 in | Wt 226.4 lb

## 2024-05-31 DIAGNOSIS — G8929 Other chronic pain: Secondary | ICD-10-CM

## 2024-05-31 DIAGNOSIS — M545 Low back pain, unspecified: Secondary | ICD-10-CM | POA: Insufficient documentation

## 2024-05-31 DIAGNOSIS — F419 Anxiety disorder, unspecified: Secondary | ICD-10-CM | POA: Diagnosis not present

## 2024-05-31 MED ORDER — DULOXETINE HCL 30 MG PO CPEP: 30.0000 mg | ORAL_CAPSULE | Freq: Every day | ORAL | 0 refills | Status: DC

## 2024-05-31 NOTE — Progress Notes (Signed)
 Subjective:  Patient ID: Marie Cox, female    DOB: [birth year]  Age: 43 y.o. MRN: [id number]  Chief Complaint  Patient presents with   Pain    HPI Pt in today requesting to restart Cymbalta.  She used to take this medication several years ago for chronic back pain and for feeling quick tempered.  She states it worked well for her .  She is being treated for chronic back pain and is having a procedure with ortho in the next several weeks She does not feel depressed but is starting to feel like she snaps quickly at people     02/03/2024    9:55 AM 10/01/2023   10:58 AM 06/17/2023    8:17 AM 08/21/2021   10:07 AM 08/24/2020   11:16 AM  Depression screen PHQ 2/9  Decreased Interest 0 0 0 0 0  Down, Depressed, Hopeless 0 0 0 0 0  PHQ - 2 Score 0 0 0 0 0  Altered sleeping 1 0 1    Tired, decreased energy 1 2 2     Change in appetite 0 0 0    Feeling bad or failure about yourself  0 0 0    Trouble concentrating 0 0 0    Moving slowly or fidgety/restless 0 0 0    Suicidal thoughts  0 0    PHQ-9 Score 2 2 3     Difficult doing work/chores Not difficult at all Not difficult at all Not difficult at all          05/22/2021    9:50 AM 08/21/2021   10:07 AM 06/17/2023    8:17 AM 10/01/2023   10:58 AM 02/03/2024    9:55 AM  Fall Risk  Falls in the past year? 0 0 0 0 0  Was there an injury with Fall? 0 0 0 0 0  Fall Risk Category Calculator 0 0 0 0 0  Fall Risk Category (Retired) Low Low     (RETIRED) Patient Fall Risk Level Low fall risk Low fall risk     Patient at Risk for Falls Due to   No Fall Risks No Fall Risks No Fall Risks  Fall risk Follow up Falls evaluation completed  Falls evaluation completed Falls evaluation completed Falls evaluation completed     ROS CONSTITUTIONAL: Negative for chills, fatigue, fever,  CARDIOVASCULAR: Negative for chest pain, RESPIRATORY: Negative for recent cough and dyspnea.   MSK: see HPI  PSYCHIATRIC: see HPI   Current Outpatient  Medications:    aspirin EC 81 MG tablet, Take 81 mg by mouth daily., Disp: , Rfl:    atorvastatin  (LIPITOR) 20 MG tablet, TAKE 1 TABLET DAILY, Disp: 90 tablet, Rfl: 2   diclofenac (VOLTAREN) 50 MG EC tablet, Take by mouth., Disp: , Rfl:    diclofenac Sodium (VOLTAREN) 1 % GEL, Apply 1 application topically daily., Disp: , Rfl:    DULoxetine (CYMBALTA) 30 MG capsule, Take 1 capsule (30 mg total) by mouth daily., Disp: 30 capsule, Rfl: 0   levocetirizine (XYZAL ALLERGY 24HR) 5 MG tablet, Take 5 mg by mouth daily., Disp: , Rfl:    linaclotide  (LINZESS ) 72 MCG capsule, Take 1 capsule (72 mcg total) by mouth daily before breakfast. Take 30 minutes before a meal, Disp: 30 capsule, Rfl: 11   lisinopril  (ZESTRIL ) 10 MG tablet, TAKE 1 TABLET DAILY, Disp: 90 tablet, Rfl: 0   metroNIDAZOLE  (METROGEL ) 0.75 % gel, Apply 1 Application topically 2 (two) times daily., Disp: 45  g, Rfl: 1   pantoprazole  (PROTONIX ) 40 MG tablet, Take 1 tablet (40 mg total) by mouth daily., Disp: 90 tablet, Rfl: 3   tiZANidine (ZANAFLEX) 4 MG tablet, Take 4 mg by mouth every 6 (six) hours as needed for muscle spasms., Disp: , Rfl:    traMADol (ULTRAM) 50 MG tablet, Take 50 mg by mouth 2 (two) times daily as needed., Disp: , Rfl:   Past Medical History:  Diagnosis Date   Acute medial meniscal tear 10/02/2020   Agatston coronary artery calcium  score less than 100 12/10/2019   Anxiety 10/04/2016   Atherosclerosis    Atypical chest pain 06/24/2019   Bilateral carpal tunnel syndrome 01/30/2021   Bursitis of shoulder, left 04/19/2021   Cellulitis 05/22/2021   Coronary artery calcification 12/10/2019   DDD (degenerative disc disease), lumbar 09/03/2016   Encounter for screening mammogram for malignant neoplasm of breast 08/24/2020   Essential hypertension 07/18/2016   Ex-smoker 12/10/2019   Frequent headaches 08/24/2020   Gastroesophageal reflux disease without esophagitis 07/18/2016   Hand numbness 01/30/2021   Incidental lung nodule  08/21/2021   Intractable migraine with aura without status migrainosus 07/18/2016   Menorrhagia with regular cycle 07/18/2016   Migraines    Mixed dyslipidemia 12/10/2019   Pulmonary nodules/lesions, multiple 11/23/2019   Objective:  PHYSICAL EXAM:   BP 122/80   Pulse 81   Temp 98.6 F (37 C) (Temporal)   Resp 18   Ht 5\' 8"  (1.727 m)   Wt 226 lb 6.4 oz (102.7 kg)   LMP  (LMP Unknown)   SpO2 97%   BMI 34.42 kg/m    GEN: Well nourished, well developed, in no acute distress  Cardiac: RRR; no murmurs,  Respiratory:  normal respiratory rate and pattern with no distress - normal breath sounds with no rales, rhonchi, wheezes or rubs  Psych: euthymic mood, appropriate affect and demeanor  Assessment & Plan:    Chronic bilateral low back pain without sciatica -     DULoxetine HCl; Take 1 capsule (30 mg total) by mouth daily.  Dispense: 30 capsule; Refill: 0  Anxiety -     DULoxetine HCl; Take 1 capsule (30 mg total) by mouth daily.  Dispense: 30 capsule; Refill: 0     Follow-up: Return for for next chronic visit.  An After Visit Summary was printed and given to the patient.  Anthonette Bastos [facility] [phone]

## 2024-06-14 ENCOUNTER — Ambulatory Visit (AMBULATORY_SURGERY_CENTER): Admitting: Gastroenterology

## 2024-06-14 ENCOUNTER — Encounter: Payer: Self-pay | Admitting: Gastroenterology

## 2024-06-14 VITALS — BP 104/77 | HR 89 | Temp 97.9°F | Resp 20 | Ht 68.0 in | Wt 232.0 lb

## 2024-06-14 DIAGNOSIS — K640 First degree hemorrhoids: Secondary | ICD-10-CM | POA: Diagnosis not present

## 2024-06-14 DIAGNOSIS — Z1211 Encounter for screening for malignant neoplasm of colon: Secondary | ICD-10-CM | POA: Diagnosis present

## 2024-06-14 DIAGNOSIS — Z83719 Family history of colon polyps, unspecified: Secondary | ICD-10-CM | POA: Diagnosis not present

## 2024-06-14 DIAGNOSIS — K5909 Other constipation: Secondary | ICD-10-CM

## 2024-06-14 MED ORDER — SODIUM CHLORIDE 0.9 % IV SOLN: 500.0000 mL | Freq: Once | INTRAVENOUS | Status: DC

## 2024-06-14 NOTE — Patient Instructions (Signed)
 Resume previous diet and medications. Repeat Colonoscopy in 5 years for surveillance purposes.  YOU HAD AN ENDOSCOPIC PROCEDURE TODAY AT THE [facility]:   Refer to the procedure report that was given to you for any specific questions about what was found during the examination.  If the procedure report does not answer your questions, please call your gastroenterologist to clarify.  If you requested that your care partner not be given the details of your procedure findings, then the procedure report has been included in a sealed envelope for you to review at your convenience later.  YOU SHOULD EXPECT: Some feelings of bloating in the abdomen. Passage of more gas than usual.  Walking can help get rid of the air that was put into your GI tract during the procedure and reduce the bloating. If you had a lower endoscopy (such as a colonoscopy or flexible sigmoidoscopy) you may notice spotting of blood in your stool or on the toilet paper. If you underwent a bowel prep for your procedure, you may not have a normal bowel movement for a few days.  Please Note:  You might notice some irritation and congestion in your nose or some drainage.  This is from the oxygen used during your procedure.  There is no need for concern and it should clear up in a day or so.  SYMPTOMS TO REPORT IMMEDIATELY:  Following lower endoscopy (colonoscopy or flexible sigmoidoscopy):  Excessive amounts of blood in the stool  Significant tenderness or worsening of abdominal pains  Swelling of the abdomen that is new, acute  Fever of 100F or higher  For urgent or emergent issues, a gastroenterologist can be reached at any hour by calling [phone]. Do not use MyChart messaging for urgent concerns.    DIET:  We do recommend a small meal at first, but then you may proceed to your regular diet.  Drink plenty of fluids but you should avoid alcoholic beverages for 24 hours.  ACTIVITY:  You should plan to take it easy  for the rest of today and you should NOT DRIVE or use heavy machinery until tomorrow (because of the sedation medicines used during the test).    FOLLOW UP: Our staff will call the number listed on your records the next business day following your procedure.  We will call around 7:15- 8:00 am to check on you and address any questions or concerns that you may have regarding the information given to you following your procedure. If we do not reach you, we will leave a message.     If any biopsies were taken you will be contacted by phone or by letter within the next 1-3 weeks.  Please call us at [phone] if you have not heard about the biopsies in 3 weeks.    SIGNATURES/CONFIDENTIALITY: You and/or your care partner have signed paperwork which will be entered into your electronic medical record.  These signatures attest to the fact that that the information above on your After Visit Summary has been reviewed and is understood.  Full responsibility of the confidentiality of this discharge information lies with you and/or your care-partner.

## 2024-06-14 NOTE — Progress Notes (Signed)
 [facility] History and Physical   Primary Care Physician:  Nicholaus Credit, PA-C   Reason for Procedure:   FH polyps (mom with >10 polyps age <23)  Plan:    colon     HPI: Marie Cox is a 43 y.o. female    Past Medical History:  Diagnosis Date   Acute medial meniscal tear 10/02/2020   Agatston coronary artery calcium  score less than 100 12/10/2019   Anxiety 10/04/2016   Atherosclerosis    Atypical chest pain 06/24/2019   Bilateral carpal tunnel syndrome 01/30/2021   Bursitis of shoulder, left 04/19/2021   Cellulitis 05/22/2021   Coronary artery calcification 12/10/2019   DDD (degenerative disc disease), lumbar 09/03/2016   Encounter for screening mammogram for malignant neoplasm of breast 08/24/2020   Essential hypertension 07/18/2016   Ex-smoker 12/10/2019   Frequent headaches 08/24/2020   Gastroesophageal reflux disease without esophagitis 07/18/2016   Hand numbness 01/30/2021   Incidental lung nodule 08/21/2021   Intractable migraine with aura without status migrainosus 07/18/2016   Menorrhagia with regular cycle 07/18/2016   Migraines    Mixed dyslipidemia 12/10/2019   Pulmonary nodules/lesions, multiple 11/23/2019   Sleep apnea    on CPAP    Past Surgical History:  Procedure Laterality Date   CARPAL TUNNEL RELEASE Right 07/02/2021   CARPAL TUNNEL RELEASE  12/14/2021   CHOLECYSTECTOMY  2005   TUBAL LIGATION  2006    Prior to Admission medications   Medication Sig Start Date End Date Taking? Authorizing Provider  aspirin EC 81 MG tablet Take 81 mg by mouth daily.   Yes [provider]  atorvastatin  (LIPITOR) 20 MG tablet TAKE 1 TABLET DAILY 04/14/24  Yes Krasowski, Robert J, MD  diclofenac (VOLTAREN) 50 MG EC tablet Take by mouth. 06/03/23  Yes [provider]  DULoxetine  (CYMBALTA ) 30 MG capsule Take 1 capsule (30 mg total) by mouth daily. 05/31/24  Yes Nicholaus Credit, PA-C  levocetirizine (XYZAL ALLERGY 24HR) 5 MG tablet  Take 5 mg by mouth daily.   Yes [provider]  linaclotide  (LINZESS ) 72 MCG capsule Take 1 capsule (72 mcg total) by mouth daily before breakfast. Take 30 minutes before a meal 04/20/24  Yes McMichael, Bayley M, PA-C  lisinopril  (ZESTRIL ) 10 MG tablet TAKE 1 TABLET DAILY 05/11/24  Yes Nicholaus Credit, PA-C  pantoprazole  (PROTONIX ) 40 MG tablet Take 1 tablet (40 mg total) by mouth daily. 04/02/24  Yes Craft, Nola, PA  tiZANidine (ZANAFLEX) 4 MG tablet Take 4 mg by mouth every 6 (six) hours as needed for muscle spasms.   Yes [provider]  traMADol (ULTRAM) 50 MG tablet Take 50 mg by mouth 2 (two) times daily as needed. 08/12/23  Yes [provider]  diclofenac Sodium (VOLTAREN) 1 % GEL Apply 1 application topically daily. 01/03/21   [provider]  metroNIDAZOLE  (METROGEL ) 0.75 % gel Apply 1 Application topically 2 (two) times daily. 02/03/24   Nicholaus Credit, PA-C    Current Outpatient Medications  Medication Sig Dispense Refill   aspirin EC 81 MG tablet Take 81 mg by mouth daily.     atorvastatin  (LIPITOR) 20 MG tablet TAKE 1 TABLET DAILY 90 tablet 2   diclofenac (VOLTAREN) 50 MG EC tablet Take by mouth.     DULoxetine  (CYMBALTA ) 30 MG capsule Take 1 capsule (30 mg total) by mouth daily. 30 capsule 0   levocetirizine (XYZAL ALLERGY 24HR) 5 MG tablet Take 5 mg by mouth daily.     linaclotide  (LINZESS )  72 MCG capsule Take 1 capsule (72 mcg total) by mouth daily before breakfast. Take 30 minutes before a meal 30 capsule 11   lisinopril  (ZESTRIL ) 10 MG tablet TAKE 1 TABLET DAILY 90 tablet 0   pantoprazole  (PROTONIX ) 40 MG tablet Take 1 tablet (40 mg total) by mouth daily. 90 tablet 3   tiZANidine (ZANAFLEX) 4 MG tablet Take 4 mg by mouth every 6 (six) hours as needed for muscle spasms.     traMADol (ULTRAM) 50 MG tablet Take 50 mg by mouth 2 (two) times daily as needed.     diclofenac Sodium (VOLTAREN) 1 % GEL Apply 1 application topically daily.     metroNIDAZOLE   (METROGEL ) 0.75 % gel Apply 1 Application topically 2 (two) times daily. 45 g 1   Current Facility-Administered Medications  Medication Dose Route Frequency Provider Last Rate Last Admin   0.9 %  sodium chloride  infusion  500 mL Intravenous Once Charlanne Groom, MD        Allergies as of 06/14/2024 - Review Complete 06/14/2024  Allergen Reaction Noted   Morphine and codeine Shortness Of Breath and Nausea And Vomiting 04/20/2019   Shellfish allergy Anaphylaxis, Itching, Nausea And Vomiting, and Shortness Of Breath 10/30/2022   Clindamycin Hives 04/24/2020    Family History  Problem Relation Age of Onset   Diabetes Mother    Hypertension Mother    Colon polyps Mother    Irritable bowel syndrome Mother    Kidney disease Mother    Heart Problems Father    Heart disease Father    Heart attack Maternal Grandfather    Heart disease Maternal Grandfather    Heart attack Paternal Grandmother    Lung cancer Paternal Grandfather    Stomach cancer Neg Hx    Colon cancer Neg Hx     Social History   Socioeconomic History   Marital status: Media planner    Spouse name: Not on file   Number of children: 1   Years of education: Not on file   Highest education level: Some college, no degree  Occupational History   Occupation: Forensic psychologist   Occupation: catering  Tobacco Use   Smoking status: Former    Types: Cigarettes    Start date: 04/09/2019   Smokeless tobacco: Never  Vaping Use   Vaping status: Never Used  Substance and Sexual Activity   Alcohol use: Never   Drug use: Never   Sexual activity: Not Currently    Partners: Male    Birth control/protection: Surgical    Comment: B.T.L.  Other Topics Concern   Not on file  Social History Narrative   Not on file   Social Drivers of Health   Financial Resource Strain: Low Risk  (04/02/2024)   Overall Financial Resource Strain (CARDIA)    Difficulty of Paying Living Expenses: Not hard at all  Food Insecurity: No Food  Insecurity (04/02/2024)   Hunger Vital Sign    Worried About Running Out of Food in the Last Year: Never true    Ran Out of Food in the Last Year: Never true  Transportation Needs: No Transportation Needs (04/02/2024)   PRAPARE - Administrator, Civil Service (Medical): No    Lack of Transportation (Non-Medical): No  Physical Activity: Insufficiently Active (04/02/2024)   Exercise Vital Sign    Days of Exercise per Week: 2 days    Minutes of Exercise per Session: 30 min  Stress: No Stress Concern Present (04/02/2024)   [employer] of  Occupational Health - Occupational Stress Questionnaire    Feeling of Stress : Not at all  Social Connections: Moderately Isolated (04/02/2024)   Social Connection and Isolation Panel    Frequency of Communication with Friends and Family: More than three times a week    Frequency of Social Gatherings with Friends and Family: More than three times a week    Attends Religious Services: Never    Database administrator or Organizations: No    Attends Banker Meetings: Never    Marital Status: Living with partner  Intimate Partner Violence: Not At Risk (10/01/2023)   Humiliation, Afraid, Rape, and Kick questionnaire    Fear of Current or Ex-Partner: No    Emotionally Abused: No    Physically Abused: No    Sexually Abused: No    Review of Systems: Positive for none All other review of systems negative except as mentioned in the HPI.  Physical Exam: Vital signs in last 24 hours: @VSRANGES @   General:   Alert,  Well-developed, well-nourished, pleasant and cooperative in NAD Lungs:  Clear throughout to auscultation.   Heart:  Regular rate and rhythm; no murmurs, clicks, rubs,  or gallops. Abdomen:  Soft, nontender and nondistended. Normal bowel sounds.   Neuro/Psych:  Alert and cooperative. Normal mood and affect. A and O x 3    No significant changes were identified.  The patient continues to be an appropriate candidate for  the planned procedure and anesthesia.   Anselm Bring, MD. [facility] 06/14/2024 8:00 AM@

## 2024-06-14 NOTE — Op Note (Signed)
 [facility] Patient Name: Marie Cox Procedure Date: 06/14/2024 7:26 AM MRN: [id number] Endoscopist: Lynnie Bring , MD, [id number] Age: 43 Referring MD:  Date of Birth: [birth year] Gender: Female Account #: [id number] Procedure:                Colonoscopy Indications:              Colon cancer screening in patient at increased                            risk: Family history of 1st-degree relative (mom)                            with colon polyps before age 26 years Medicines:                Monitored Anesthesia Care Procedure:                Pre-Anesthesia Assessment:                           - Prior to the procedure, a History and Physical                            was performed, and patient medications and                            allergies were reviewed. The patient's tolerance of                            previous anesthesia was also reviewed. The risks                            and benefits of the procedure and the sedation                            options and risks were discussed with the patient.                            All questions were answered, and informed consent                            was obtained. Prior Anticoagulants: The patient has                            taken no anticoagulant or antiplatelet agents. ASA                            Grade Assessment: II - A patient with mild systemic                            disease. After reviewing the risks and benefits,                            the patient was deemed in satisfactory condition to  undergo the procedure.                           After obtaining informed consent, the colonoscope                            was passed under direct vision. Throughout the                            procedure, the patient's blood pressure, pulse, and                            oxygen saturations were monitored continuously. The                            CF [id number] #[id number]  was introduced through the anus                            and advanced to the 2 cm into the ileum. The                            colonoscopy was performed without difficulty. The                            patient tolerated the procedure well. The quality                            of the bowel preparation was good. The terminal                            ileum, ileocecal valve, appendiceal orifice, and                            rectum were photographed. Scope In: 8:04:42 AM Scope Out: 8:17:14 AM Scope Withdrawal Time: 0 hours 8 minutes 55 seconds  Total Procedure Duration: 0 hours 12 minutes 32 seconds  Findings:                 The colon (entire examined portion) appeared normal.                           Non-bleeding internal hemorrhoids were found during                            retroflexion. The hemorrhoids were small and Grade                            I (internal hemorrhoids that do not prolapse).                           The terminal ileum appeared normal.                           Retroflexion in the right colon was performed.  The exam was otherwise without abnormality on                            direct and retroflexion views. Complications:            No immediate complications. Estimated Blood Loss:     Estimated blood loss: none. Impression:               - The entire examined colon is normal.                           - Non-bleeding internal hemorrhoids.                           - The examined portion of the ileum was normal.                           - The examination was otherwise normal on direct                            and retroflexion views.                           - No specimens collected. Recommendation:           - Patient has a contact number available for                            emergencies. The signs and symptoms of potential                            delayed complications were discussed with the                             patient. Return to normal activities tomorrow.                            Written discharge instructions were provided to the                            patient.                           - Resume previous diet.                           - Continue present medications.                           - Repeat colonoscopy in 5 years for screening                            purposes.                           - The findings and recommendations were discussed  with the patient's family. Lynnie Bring, MD 06/14/2024 8:21:33 AM This report has been signed electronically.

## 2024-06-14 NOTE — Progress Notes (Signed)
 Sedate, gd SR, tolerated procedure well, VSS, report to RN

## 2024-06-15 ENCOUNTER — Telehealth: Payer: Self-pay

## 2024-06-15 NOTE — Telephone Encounter (Signed)
 Follow up call to pt, lm for pt to call if having any difficulty with normal activities or eating and drinking.  Also to call if any other questions or concerns.

## 2024-06-23 ENCOUNTER — Encounter: Payer: Self-pay | Admitting: Physician Assistant

## 2024-06-23 ENCOUNTER — Other Ambulatory Visit: Payer: Self-pay | Admitting: Physician Assistant

## 2024-06-23 DIAGNOSIS — G8929 Other chronic pain: Secondary | ICD-10-CM

## 2024-06-23 DIAGNOSIS — F419 Anxiety disorder, unspecified: Secondary | ICD-10-CM

## 2024-06-23 MED ORDER — DULOXETINE HCL 30 MG PO CPEP: 30.0000 mg | ORAL_CAPSULE | Freq: Every day | ORAL | 1 refills | Status: DC

## 2024-06-30 ENCOUNTER — Other Ambulatory Visit: Payer: Self-pay | Admitting: Physician Assistant

## 2024-06-30 DIAGNOSIS — F419 Anxiety disorder, unspecified: Secondary | ICD-10-CM

## 2024-06-30 DIAGNOSIS — G8929 Other chronic pain: Secondary | ICD-10-CM

## 2024-08-04 ENCOUNTER — Encounter: Payer: Self-pay | Admitting: Physician Assistant

## 2024-08-04 ENCOUNTER — Ambulatory Visit: Payer: Commercial Managed Care - HMO | Admitting: Physician Assistant

## 2024-08-04 VITALS — BP 104/70 | HR 74 | Temp 98.3°F | Ht 68.0 in | Wt 223.0 lb

## 2024-08-04 DIAGNOSIS — I10 Essential (primary) hypertension: Secondary | ICD-10-CM

## 2024-08-04 DIAGNOSIS — K219 Gastro-esophageal reflux disease without esophagitis: Secondary | ICD-10-CM | POA: Diagnosis not present

## 2024-08-04 DIAGNOSIS — K581 Irritable bowel syndrome with constipation: Secondary | ICD-10-CM

## 2024-08-04 DIAGNOSIS — R7303 Prediabetes: Secondary | ICD-10-CM

## 2024-08-04 DIAGNOSIS — E782 Mixed hyperlipidemia: Secondary | ICD-10-CM | POA: Diagnosis not present

## 2024-08-04 DIAGNOSIS — G8929 Other chronic pain: Secondary | ICD-10-CM

## 2024-08-04 DIAGNOSIS — L719 Rosacea, unspecified: Secondary | ICD-10-CM

## 2024-08-04 DIAGNOSIS — F419 Anxiety disorder, unspecified: Secondary | ICD-10-CM

## 2024-08-04 DIAGNOSIS — M545 Low back pain, unspecified: Secondary | ICD-10-CM

## 2024-08-04 DIAGNOSIS — D229 Melanocytic nevi, unspecified: Secondary | ICD-10-CM

## 2024-08-04 MED ORDER — LISINOPRIL 10 MG PO TABS: 10.0000 mg | ORAL_TABLET | Freq: Every day | ORAL | 1 refills | Status: DC

## 2024-08-04 NOTE — Progress Notes (Signed)
 Subjective:  Patient ID: Marie Cox, female    DOB: [birth year]  Age: 43 y.o. MRN: [id number]  Chief Complaint  Patient presents with   Medical Management of Chronic Issues    HPI Pt presents for follow up of hypertension.  The patient is tolerating the medication well without side effects. Compliance with treatment has been good; including taking medication as directed , maintains a healthy diet and regular exercise regimen , and following up as directed.  Pt currently on zestril  10mg   Denies chest pain/sob/edema  Mixed hyperlipidemia  Pt presents with hyperlipidemia.  Compliance with treatment has been good  The patient is compliant with medications, maintains a low cholesterol diet , follows up as directed , and maintains an exercise regimen . The patient denies experiencing any hypercholesterolemia related symptoms.  Pt taking lipitor 20mg  qd  Pt with GERD - stable on protonix  40mg  qd  Pt with chronic back pain - [facility] - has DJD and herniated disc - has received spinal injections - for these issues she is taking voltaren 50mg  qd and uses  zanaflex and tramadol  Pt with prediabetes - due to check labwork  Pt with anxiety - stable on cymbalta  30mg  qd  Pt with rosacea - stable using metrogel   Pt with IBS with constipation - uses linzess  which helps with symptoms  Pt has mole on neck that has been getting bigger and changing color    08/04/2024    8:24 AM 02/03/2024    9:55 AM 10/01/2023   10:58 AM 06/17/2023    8:17 AM 08/21/2021   10:07 AM  Depression screen PHQ 2/9  Decreased Interest 0 0 0 0 0  Down, Depressed, Hopeless 0 0 0 0 0  PHQ - 2 Score 0 0 0 0 0  Altered sleeping  1 0 1   Tired, decreased energy  1 2 2    Change in appetite  0 0 0   Feeling bad or failure about yourself   0 0 0   Trouble concentrating  0 0 0   Moving slowly or fidgety/restless  0 0 0   Suicidal thoughts   0 0   PHQ-9 Score  2 2 3     Difficult doing work/chores  Not difficult at all Not difficult at all Not difficult at all         08/21/2021   10:07 AM 06/17/2023    8:17 AM 10/01/2023   10:58 AM 02/03/2024    9:55 AM 08/04/2024    8:24 AM  Fall Risk  Falls in the past year? 0 0 0 0 0  Was there an injury with Fall? 0 0 0 0 0  Fall Risk Category Calculator 0 0 0 0 0  Fall Risk Category (Retired) Low       (RETIRED) Patient Fall Risk Level Low fall risk       Patient at Risk for Falls Due to  No Fall Risks No Fall Risks No Fall Risks No Fall Risks  Fall risk Follow up  Falls evaluation completed Falls evaluation completed Falls evaluation completed Falls evaluation completed     Data saved with a previous flowsheet row definition    CONSTITUTIONAL: Negative for chills, fatigue, fever, unintentional weight gain and unintentional weight loss.  E/N/T: Negative for ear pain, nasal congestion and sore throat.  CARDIOVASCULAR: Negative for chest pain, dizziness, palpitations and pedal edema.  RESPIRATORY: Negative for recent cough and  dyspnea.  GASTROINTESTINAL: Negative for abdominal pain, acid reflux symptoms, constipation, diarrhea, nausea and vomiting.  MSK: Negative for arthralgias and myalgias.  INTEGUMENTARY: see HPI NEUROLOGICAL: Negative for dizziness and headaches.  PSYCHIATRIC: Negative for sleep disturbance and to question depression screen.  Negative for depression, negative for anhedonia.       Current Outpatient Medications:    aspirin EC 81 MG tablet, Take 81 mg by mouth daily., Disp: , Rfl:    atorvastatin  (LIPITOR) 20 MG tablet, TAKE 1 TABLET DAILY, Disp: 90 tablet, Rfl: 2   diclofenac (VOLTAREN) 50 MG EC tablet, Take by mouth., Disp: , Rfl:    DULoxetine  (CYMBALTA ) 30 MG capsule, Take 1 capsule by mouth once daily., Disp: 30 capsule, Rfl: 0   levocetirizine (XYZAL ALLERGY 24HR) 5 MG tablet, Take 5 mg by mouth daily., Disp: , Rfl:    linaclotide  (LINZESS ) 72 MCG capsule, Take 1 capsule (72 mcg total)  by mouth daily before breakfast. Take 30 minutes before a meal, Disp: 30 capsule, Rfl: 11   metroNIDAZOLE  (METROGEL ) 0.75 % gel, Apply 1 Application topically 2 (two) times daily., Disp: 45 g, Rfl: 1   pantoprazole  (PROTONIX ) 40 MG tablet, Take 1 tablet (40 mg total) by mouth daily., Disp: 90 tablet, Rfl: 3   tiZANidine (ZANAFLEX) 4 MG tablet, Take 4 mg by mouth every 6 (six) hours as needed for muscle spasms., Disp: , Rfl:    traMADol (ULTRAM) 50 MG tablet, Take 50 mg by mouth 2 (two) times daily as needed., Disp: , Rfl:    lisinopril  (ZESTRIL ) 10 MG tablet, Take 1 tablet (10 mg total) by mouth daily., Disp: 90 tablet, Rfl: 1  Past Medical History:  Diagnosis Date   Acute medial meniscal tear 10/02/2020   Agatston coronary artery calcium  score less than 100 12/10/2019   Anxiety 10/04/2016   Atherosclerosis    Atypical chest pain 06/24/2019   Bilateral carpal tunnel syndrome 01/30/2021   Bursitis of shoulder, left 04/19/2021   Cellulitis 05/22/2021   Coronary artery calcification 12/10/2019   DDD (degenerative disc disease), lumbar 09/03/2016   Encounter for screening mammogram for malignant neoplasm of breast 08/24/2020   Essential hypertension 07/18/2016   Ex-smoker 12/10/2019   Frequent headaches 08/24/2020   Gastroesophageal reflux disease without esophagitis 07/18/2016   Hand numbness 01/30/2021   Incidental lung nodule 08/21/2021   Intractable migraine with aura without status migrainosus 07/18/2016   Menorrhagia with regular cycle 07/18/2016   Migraines    Mixed dyslipidemia 12/10/2019   Pulmonary nodules/lesions, multiple 11/23/2019   Sleep apnea    on CPAP   Objective:  PHYSICAL EXAM:   VS: BP 104/70   Pulse 74   Temp 98.3 F (36.8 C)   Ht 5' 8 (1.727 m)   Wt 223 lb (101.2 kg)   SpO2 100%   BMI 33.91 kg/m   GEN: Well nourished, well developed, in no acute distress   Cardiac: RRR; no murmurs, rubs, or gallops,no edema -  Respiratory:  normal respiratory rate  and pattern with no distress - normal breath sounds with no rales, rhonchi, wheezes or rubs GI: normal bowel sounds, no masses or tenderness MS: no deformity or atrophy  Skin: warm and dry, no rash - atypical nevus noted on neck Neuro:  Alert and Oriented x 3, Strength and sensation are intact - CN II-Xii grossly intact Psych: euthymic mood, appropriate affect and demeanor   Assessment & Plan:    Prediabetes -     Comprehensive metabolic panel -  Hemoglobin A1c Watch diet Gastroesophageal reflux disease without esophagitis Continue protonix  Essential hypertension -     CBC with Differential/Platelet -     Comprehensive metabolic panel -     TSH Continue zestril  Mixed hyperlipidemia -     Comprehensive metabolic panel -     Lipid panel Continue lipitor  Rosacea Continue metrogel   Atypical nevus Referral to dermatology  Chronic bilateral low back pain Follow up with specialist as directed  Irritable bowel syndrome with constipation Continue linzess   Anxiety Continue cymbalta     Follow-up: Return in about 4 months (around 12/04/2024) for fating physical with pap - 40 min.  An After Visit Summary was printed and given to the patient.  CAMIE JONELLE NICHOLAUS DEVONNA [facility] [phone]

## 2024-08-05 ENCOUNTER — Ambulatory Visit: Payer: Self-pay | Admitting: Physician Assistant

## 2024-08-05 LAB — COMPREHENSIVE METABOLIC PANEL WITH GFR
ALT: 14 IU/L (ref 0–32)
AST: 14 IU/L (ref 0–40)
Albumin: 4.1 g/dL (ref 3.9–4.9)
Alkaline Phosphatase: 124 IU/L — ABNORMAL HIGH (ref 44–121)
BUN/Creatinine Ratio: 15 (ref 9–23)
BUN: 12 mg/dL (ref 6–24)
Bilirubin Total: 0.7 mg/dL (ref 0.0–1.2)
CO2: 21 mmol/L (ref 20–29)
Calcium: 9.4 mg/dL (ref 8.7–10.2)
Chloride: 101 mmol/L (ref 96–106)
Creatinine, Ser: 0.82 mg/dL (ref 0.57–1.00)
Globulin, Total: 2.6 g/dL (ref 1.5–4.5)
Glucose: 98 mg/dL (ref 70–99)
Potassium: 4.7 mmol/L (ref 3.5–5.2)
Sodium: 139 mmol/L (ref 134–144)
Total Protein: 6.7 g/dL (ref 6.0–8.5)
eGFR: 90 mL/min/1.73 (ref >59)

## 2024-08-05 LAB — CBC WITH DIFFERENTIAL/PLATELET
Basophils Absolute: 0.1 x10E3/uL (ref 0.0–0.2)
Basos: 1 %
EOS (ABSOLUTE): 0.5 x10E3/uL — ABNORMAL HIGH (ref 0.0–0.4)
Eos: 9 %
Hematocrit: 41.6 % (ref 34.0–46.6)
Hemoglobin: 12.7 g/dL (ref 11.1–15.9)
Immature Grans (Abs): 0 x10E3/uL (ref 0.0–0.1)
Immature Granulocytes: 0 %
Lymphocytes Absolute: 0.5 x10E3/uL — ABNORMAL LOW (ref 0.7–3.1)
Lymphs: 10 %
MCH: 25.8 pg — ABNORMAL LOW (ref 26.6–33.0)
MCHC: 30.5 g/dL — ABNORMAL LOW (ref 31.5–35.7)
MCV: 85 fL (ref 79–97)
Monocytes Absolute: 0.6 x10E3/uL (ref 0.1–0.9)
Monocytes: 12 %
Neutrophils Absolute: 3.3 x10E3/uL (ref 1.4–7.0)
Neutrophils: 68 %
Platelets: 412 x10E3/uL (ref 150–450)
RBC: 4.92 x10E6/uL (ref 3.77–5.28)
RDW: 14.5 % (ref 11.7–15.4)
WBC: 5 x10E3/uL (ref 3.4–10.8)

## 2024-08-05 LAB — HEMOGLOBIN A1C
Est. average glucose Bld gHb Est-mCnc: 117 mg/dL
Hgb A1c MFr Bld: 5.7 % — ABNORMAL HIGH (ref 4.8–5.6)

## 2024-08-05 LAB — LIPID PANEL
Chol/HDL Ratio: 2.9 ratio (ref 0.0–4.4)
Cholesterol, Total: 134 mg/dL (ref 100–199)
HDL: 46 mg/dL (ref >39)
LDL Chol Calc (NIH): 73 mg/dL (ref 0–99)
Triglycerides: 73 mg/dL (ref 0–149)
VLDL Cholesterol Cal: 15 mg/dL (ref 5–40)

## 2024-08-05 LAB — TSH: TSH: 3.07 u[IU]/mL (ref 0.450–4.500)

## 2024-08-20 ENCOUNTER — Ambulatory Visit
Admission: RE | Admit: 2024-08-20 | Discharge: 2024-08-20 | Disposition: A | Discharge status: Home / Self Care | Payer: Commercial Managed Care - HMO | Source: Ambulatory Visit | Attending: Physician Assistant | Admitting: Physician Assistant

## 2024-08-20 DIAGNOSIS — Z1231 Encounter for screening mammogram for malignant neoplasm of breast: Secondary | ICD-10-CM

## 2024-08-30 ENCOUNTER — Ambulatory Visit: Admitting: Physician Assistant

## 2024-08-30 ENCOUNTER — Encounter: Payer: Self-pay | Admitting: Physician Assistant

## 2024-08-30 VITALS — BP 120/88 | HR 93 | Temp 98.2°F | Resp 18 | Ht 68.0 in | Wt 223.4 lb

## 2024-08-30 DIAGNOSIS — J069 Acute upper respiratory infection, unspecified: Secondary | ICD-10-CM | POA: Diagnosis not present

## 2024-08-30 DIAGNOSIS — U071 COVID-19: Secondary | ICD-10-CM

## 2024-08-30 LAB — POCT FLU A/B STATUS
Influenza A, POC: NEGATIVE
Influenza B, POC: NEGATIVE

## 2024-08-30 LAB — POC COVID19 BINAXNOW: SARS Coronavirus 2 Ag: POSITIVE — AB

## 2024-08-30 MED ORDER — AZITHROMYCIN 250 MG PO TABS: ORAL_TABLET | ORAL | 0 refills | Status: AC

## 2024-08-30 NOTE — Progress Notes (Signed)
 Acute Office Visit  Subjective:    Patient ID: Marie Cox, female    DOB: [birth year], 43 y.o.   MRN: [id number]  Chief Complaint  Patient presents with   Cough   Nasal Congestion    HPI: Patient is in today for complaints of malaise, cough and congestion for the past 5 days.  Denies fever but has had malaise.  Now with sore throat, clear nasal drainage and productive cough   Current Outpatient Medications:    aspirin EC 81 MG tablet, Take 81 mg by mouth daily., Disp: , Rfl:    atorvastatin  (LIPITOR) 20 MG tablet, TAKE 1 TABLET DAILY, Disp: 90 tablet, Rfl: 2   azithromycin  (ZITHROMAX ) 250 MG tablet, Take 2 tablets on day 1, then 1 tablet daily on days 2 through 5, Disp: 6 tablet, Rfl: 0   diclofenac (VOLTAREN) 50 MG EC tablet, Take by mouth., Disp: , Rfl:    DULoxetine  (CYMBALTA ) 30 MG capsule, Take 1 capsule by mouth once daily., Disp: 30 capsule, Rfl: 0   levocetirizine (XYZAL ALLERGY 24HR) 5 MG tablet, Take 5 mg by mouth daily., Disp: , Rfl:    linaclotide  (LINZESS ) 72 MCG capsule, Take 1 capsule (72 mcg total) by mouth daily before breakfast. Take 30 minutes before a meal, Disp: 30 capsule, Rfl: 11   lisinopril  (ZESTRIL ) 10 MG tablet, Take 1 tablet (10 mg total) by mouth daily., Disp: 90 tablet, Rfl: 1   metroNIDAZOLE  (METROGEL ) 0.75 % gel, Apply 1 Application topically 2 (two) times daily., Disp: 45 g, Rfl: 1   pantoprazole  (PROTONIX ) 40 MG tablet, Take 1 tablet (40 mg total) by mouth daily., Disp: 90 tablet, Rfl: 3   tiZANidine (ZANAFLEX) 4 MG tablet, Take 4 mg by mouth every 6 (six) hours as needed for muscle spasms., Disp: , Rfl:    traMADol (ULTRAM) 50 MG tablet, Take 50 mg by mouth 2 (two) times daily as needed., Disp: , Rfl:   Allergies  Allergen Reactions   Morphine And Codeine Shortness Of Breath and Nausea And Vomiting   Shellfish Allergy Anaphylaxis, Itching, Nausea And Vomiting and Shortness Of Breath   Clindamycin Hives    ROS CONSTITUTIONAL: see  HPI E/N/T: see HPI CARDIOVASCULAR: Negative for chest pain, RESPIRATORY: see HPI GASTROINTESTINAL: Negative for abdominal pain, acid reflux symptoms, constipation, diarrhea, nausea and vomiting.       Objective:    PHYSICAL EXAM:   BP 120/88   Pulse 93   Temp 98.2 F (36.8 C) (Temporal)   Resp 18   Ht 5' 8 (1.727 m)   Wt 223 lb 6.4 oz (101.3 kg)   LMP  (LMP Unknown)   SpO2 100%   BMI 33.97 kg/m    GEN: Well nourished, well developed, in no acute distress  HEENT: normal external ears and nose - normal external auditory canals and TMS - hearing grossly normal - - Lips, Teeth and Gums - normal  Oropharynx - moderate erythema/pnd Cardiac: RRR; no murmurs,  Respiratory:  faint rhonchi - clears with cough Skin: warm and dry, no rash   Office Visit on 08/30/2024  Component Date Value Ref Range Status   Influenza A, POC 08/30/2024 Negative  Negative Final   Influenza B, POC 08/30/2024 Negative  Negative Final   SARS Coronavirus 2 Ag 08/30/2024 Positive (A)  Negative Final        Assessment & Plan:    Acute URI -     POCT Flu A & B Status -  POC COVID-19 BinaxNow -     Azithromycin ; Take 2 tablets on day 1, then 1 tablet daily on days 2 through 5  Dispense: 6 tablet; Refill: 0  COVID-19 Rest, fluids, mask around immunocomprised people    Follow-up: Return if symptoms worsen or fail to improve.  An After Visit Summary was printed and given to the patient.  CAMIE JONELLE NICHOLAUS DEVONNA [facility] [phone]

## 2024-11-02 ENCOUNTER — Ambulatory Visit: Admitting: Physician Assistant

## 2024-11-11 ENCOUNTER — Encounter: Payer: Self-pay | Admitting: Physician Assistant

## 2024-11-11 ENCOUNTER — Ambulatory Visit: Admitting: Physician Assistant

## 2024-11-11 ENCOUNTER — Ambulatory Visit (INDEPENDENT_AMBULATORY_CARE_PROVIDER_SITE_OTHER)
Admission: RE | Admit: 2024-11-11 | Discharge: 2024-11-11 | Disposition: A | Discharge status: Home / Self Care | Source: Ambulatory Visit | Attending: Physician Assistant | Admitting: Physician Assistant

## 2024-11-11 VITALS — BP 114/84 | HR 77 | Temp 98.2°F | Resp 18 | Ht 68.0 in | Wt 240.0 lb

## 2024-11-11 DIAGNOSIS — R0789 Other chest pain: Secondary | ICD-10-CM | POA: Diagnosis not present

## 2024-11-11 MED ORDER — CYCLOBENZAPRINE HCL 5 MG PO TABS: 5.0000 mg | ORAL_TABLET | Freq: Three times a day (TID) | ORAL | 0 refills | Status: DC | PRN

## 2024-11-11 MED ORDER — PREDNISONE 20 MG PO TABS: ORAL_TABLET | ORAL | 0 refills | Status: DC

## 2024-11-11 NOTE — Progress Notes (Signed)
 Acute Office Visit  Subjective:    Patient ID: Marie Cox, female    DOB: [birth year], 43 y.o.   MRN: [id number]  Chief Complaint  Patient presents with   Muscle Pain    HPI: Patient is in today for chest wall pain.  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  Describes as tender to touch, worse with movement and deep breathing.  She had gone to ED about 2 weeks ago and was given short dose of prednisone  - she states she is minimally better.   Records show ED only did a lumbar xray and no other testing (pt has known history of spondylosis) Pt denies any dyspnea   Current Outpatient Medications:    aspirin EC 81 MG tablet, Take 81 mg by mouth daily., Disp: , Rfl:    atorvastatin  (LIPITOR) 20 MG tablet, TAKE 1 TABLET DAILY, Disp: 90 tablet, Rfl: 2   cyclobenzaprine  (FLEXERIL ) 5 MG tablet, Take 1 tablet (5 mg total) by mouth 3 (three) times daily as needed for muscle spasms., Disp: 30 tablet, Rfl: 0   diclofenac (VOLTAREN) 50 MG EC tablet, Take by mouth., Disp: , Rfl:    DULoxetine  (CYMBALTA ) 30 MG capsule, Take 1 capsule by mouth once daily., Disp: 30 capsule, Rfl: 0   levocetirizine (XYZAL ALLERGY 24HR) 5 MG tablet, Take 5 mg by mouth daily., Disp: , Rfl:    linaclotide  (LINZESS ) 72 MCG capsule, Take 1 capsule (72 mcg total) by mouth daily before breakfast. Take 30 minutes before a meal, Disp: 30 capsule, Rfl: 11   lisinopril  (ZESTRIL ) 10 MG tablet, Take 1 tablet (10 mg total) by mouth daily., Disp: 90 tablet, Rfl: 1   metroNIDAZOLE  (METROGEL ) 0.75 % gel, Apply 1 Application topically 2 (two) times daily., Disp: 45 g, Rfl: 1   pantoprazole  (PROTONIX ) 40 MG tablet, Take 1 tablet (40 mg total) by mouth daily., Disp: 90 tablet, Rfl: 3   predniSONE  (DELTASONE ) 20 MG tablet, 1 po tid for 3 days then 1 po  bid for 3 days then 1 po qd for 3 days, Disp: 18 tablet, Rfl: 0   traMADol (ULTRAM) 50 MG tablet, Take 50 mg by mouth 2 (two) times daily as needed., Disp: , Rfl:   Allergies  Allergen Reactions   Morphine And Codeine Shortness Of Breath and Nausea And Vomiting   Shellfish Allergy Anaphylaxis, Itching, Nausea And Vomiting and Shortness Of Breath   Clindamycin Hives    ROS CONSTITUTIONAL: Negative for chills, fatigue, fever,  E/N/T: Negative for ear pain, nasal congestion and sore throat.  CARDIOVASCULAR: Negative for chest pain, dizziness, palpitations and pedal edema.  RESPIRATORY: Negative for recent cough and dyspnea.  GASTROINTESTINAL: Negative for abdominal pain, acid reflux symptoms, constipation, diarrhea, nausea and vomiting.  MSK: see HPI      Objective:    PHYSICAL EXAM:   BP 114/84   Pulse 77   Temp 98.2 F (36.8 C) (Temporal)   Resp 18   Ht 5' 8 (1.727 m)   Wt 240 lb (108.9 kg)   SpO2 97%   BMI 36.49 kg/m    GEN: Well nourished, well developed, in no acute distress  Cardiac: RRR; no murmurs, rubs, or gallops,no edema -  Respiratory:  normal respiratory rate and pattern with no distress - normal  breath sounds with no rales, rhonchi, wheezes or rubs GI: normal bowel sounds, no masses or tenderness MS: tender to mid back and bilaterally lower chest wall anteriorly Skin: warm and dry, no rash       Assessment & Plan:    Chest wall pain -     DG Chest 2 View; Future -     predniSONE ; 1 po tid for 3 days then 1 po bid for 3 days then 1 po qd for 3 days  Dispense: 18 tablet; Refill: 0 -     Cyclobenzaprine  HCl; Take 1 tablet (5 mg total) by mouth 3 (three) times daily as needed for muscle spasms.  Dispense: 30 tablet; Refill: 0     Follow-up: Return if symptoms worsen or fail to improve.  An After Visit Summary was printed and given to the patient.  CAMIE JONELLE NICHOLAUS [facility]ON[facility] [phone]

## 2024-11-17 ENCOUNTER — Ambulatory Visit: Payer: Self-pay | Admitting: Physician Assistant

## 2024-11-17 ENCOUNTER — Encounter: Payer: Self-pay | Admitting: Physician Assistant

## 2024-11-17 ENCOUNTER — Other Ambulatory Visit: Payer: Self-pay | Admitting: Physician Assistant

## 2024-11-17 MED ORDER — AMOXICILLIN-POT CLAVULANATE 875-125 MG PO TABS: 1.0000 | ORAL_TABLET | Freq: Two times a day (BID) | ORAL | 0 refills | Status: DC

## 2024-12-07 ENCOUNTER — Encounter: Payer: Self-pay | Admitting: Physician Assistant

## 2024-12-07 ENCOUNTER — Ambulatory Visit: Admitting: Physician Assistant

## 2024-12-07 VITALS — BP 112/80 | HR 85 | Temp 97.8°F | Resp 18 | Ht 68.5 in | Wt 252.0 lb

## 2024-12-07 DIAGNOSIS — Z0000 Encounter for general adult medical examination without abnormal findings: Secondary | ICD-10-CM | POA: Diagnosis not present

## 2024-12-07 DIAGNOSIS — R3121 Asymptomatic microscopic hematuria: Secondary | ICD-10-CM | POA: Diagnosis not present

## 2024-12-07 DIAGNOSIS — I10 Essential (primary) hypertension: Secondary | ICD-10-CM | POA: Diagnosis not present

## 2024-12-07 DIAGNOSIS — R7303 Prediabetes: Secondary | ICD-10-CM | POA: Diagnosis not present

## 2024-12-07 LAB — POCT URINALYSIS DIP (CLINITEK)
Bilirubin, UA: NEGATIVE
Glucose, UA: NEGATIVE mg/dL
Ketones, POC UA: NEGATIVE mg/dL
Nitrite, UA: NEGATIVE
POC PROTEIN,UA: NEGATIVE
Spec Grav, UA: 1.025 (ref 1.010–1.025)
Urobilinogen, UA: 0.2 U/dL
pH, UA: 5.5 (ref 5.0–8.0)

## 2024-12-07 LAB — CBC WITH DIFFERENTIAL/PLATELET
Basophils Absolute: 0 x10E3/uL (ref 0.0–0.2)
Basos: 0 %
EOS (ABSOLUTE): 0.2 x10E3/uL (ref 0.0–0.4)
Eos: 5 %
Hematocrit: 39.7 % (ref 34.0–46.6)
Hemoglobin: 12.2 g/dL (ref 11.1–15.9)
Immature Grans (Abs): 0 x10E3/uL (ref 0.0–0.1)
Immature Granulocytes: 0 %
Lymphocytes Absolute: 0.4 x10E3/uL — ABNORMAL LOW (ref 0.7–3.1)
Lymphs: 8 %
MCH: 26.3 pg — ABNORMAL LOW (ref 26.6–33.0)
MCHC: 30.7 g/dL — ABNORMAL LOW (ref 31.5–35.7)
MCV: 86 fL (ref 79–97)
Monocytes Absolute: 0.5 x10E3/uL (ref 0.1–0.9)
Monocytes: 10 %
Neutrophils Absolute: 3.7 x10E3/uL (ref 1.4–7.0)
Neutrophils: 77 %
Platelets: 330 x10E3/uL (ref 150–450)
RBC: 4.63 x10E6/uL (ref 3.77–5.28)
RDW: 15.9 % — ABNORMAL HIGH (ref 11.7–15.4)
WBC: 4.8 x10E3/uL (ref 3.4–10.8)

## 2024-12-07 LAB — COMPREHENSIVE METABOLIC PANEL WITH GFR
ALT: 23 IU/L (ref 0–32)
AST: 24 IU/L (ref 0–40)
Albumin: 3.8 g/dL — ABNORMAL LOW (ref 3.9–4.9)
Alkaline Phosphatase: 101 IU/L (ref 41–116)
BUN/Creatinine Ratio: 20 (ref 9–23)
BUN: 13 mg/dL (ref 6–24)
Bilirubin Total: 0.6 mg/dL (ref 0.0–1.2)
CO2: 21 mmol/L (ref 20–29)
Calcium: 8.9 mg/dL (ref 8.7–10.2)
Chloride: 104 mmol/L (ref 96–106)
Creatinine, Ser: 0.65 mg/dL (ref 0.57–1.00)
Globulin, Total: 2.3 g/dL (ref 1.5–4.5)
Glucose: 87 mg/dL (ref 70–99)
Potassium: 4.3 mmol/L (ref 3.5–5.2)
Sodium: 138 mmol/L (ref 134–144)
Total Protein: 6.1 g/dL (ref 6.0–8.5)
eGFR: 111 mL/min/1.73 (ref >59)

## 2024-12-07 LAB — LIPID PANEL
Chol/HDL Ratio: 2.2 ratio (ref 0.0–4.4)
Cholesterol, Total: 155 mg/dL (ref 100–199)
HDL: 70 mg/dL (ref >39)
LDL Chol Calc (NIH): 69 mg/dL (ref 0–99)
Triglycerides: 86 mg/dL (ref 0–149)
VLDL Cholesterol Cal: 16 mg/dL (ref 5–40)

## 2024-12-07 LAB — HEMOGLOBIN A1C
Est. average glucose Bld gHb Est-mCnc: 117 mg/dL
Hgb A1c MFr Bld: 5.7 % — ABNORMAL HIGH (ref 4.8–5.6)

## 2024-12-07 LAB — TSH: TSH: 2.62 u[IU]/mL (ref 0.450–4.500)

## 2024-12-07 NOTE — Progress Notes (Signed)
 Subjective:  Patient ID: Marie Cox, female    DOB: 1981-06-04  Age: 43 y.o. MRN: [id number]  Chief Complaint  Patient presents with   Annual Exam    HPI Well Adult Physical: Patient here for a comprehensive physical exam.The patient reports no problems Do you take any herbs or supplements that were not prescribed by a doctor? no Are you taking calcium  supplements? no Are you taking aspirin daily? no  Encounter for general adult medical examination without abnormal findings  Physical (At Risk items are starred): Patient's last physical exam was 1 year ago .  Patient is not afflicted from Stress Incontinence and Urge Incontinence  Patient wears a seat belts Patient has smoke detectors and has carbon monoxide detectors. Patient practices appropriate gun safety. Patient wears sunscreen with extended sun exposure. Dental Care:wears dentures Vision impairments: glasses Ophthalmology/Optometry: overdue Hearing loss: none  LMP mid November (Menstrual status: Bilateral Tubal Ligation). Safe at home: Yes Self breast exams: Yes Last pap: is due Last mammogram: up to date     12/07/2024    9:39 AM 08/04/2024    8:24 AM 02/03/2024    9:55 AM 10/01/2023   10:58 AM 06/17/2023    8:17 AM  Depression screen PHQ 2/9  Decreased Interest 0 0 0 0 0  Down, Depressed, Hopeless 0 0 0 0 0  PHQ - 2 Score 0 0 0 0 0  Altered sleeping   1 0 1  Tired, decreased energy   1 2 2   Change in appetite   0 0 0  Feeling bad or failure about yourself    0 0 0  Trouble concentrating   0 0 0  Moving slowly or fidgety/restless   0 0 0  Suicidal thoughts    0 0  PHQ-9 Score   2  2  3    Difficult doing work/chores   Not difficult at all Not difficult at all Not difficult at all     Data saved with a previous flowsheet row definition         06/17/2023    8:17 AM 10/01/2023   10:58 AM 02/03/2024    9:55 AM 08/04/2024    8:24 AM 12/07/2024    9:40 AM  Fall Risk  Falls in the past year? 0 0 0 0 0   Was there an injury with Fall? 0  0  0  0  0  Fall Risk Category Calculator 0 0 0 0 0  Patient at Risk for Falls Due to No Fall Risks No Fall Risks No Fall Risks No Fall Risks No Fall Risks  Fall risk Follow up Falls evaluation completed Falls evaluation completed Falls evaluation completed Falls evaluation completed Falls evaluation completed     Data saved with a previous flowsheet row definition             Social Hx   Social History   Socioeconomic History   Marital status: Media Planner    Spouse name: Not on file   Number of children: 1   Years of education: Not on file   Highest education level: Some college, no degree  Occupational History   Occupation: Forensic Psychologist   Occupation: catering  Tobacco Use   Smoking status: Former    Types: Cigarettes    Start date: 04/09/2019   Smokeless tobacco: Never  Vaping Use   Vaping status: Never Used  Substance and Sexual Activity   Alcohol use: Never   Drug use: Never  Sexual activity: Yes    Partners: Male    Birth control/protection: Surgical    Comment: B.T.L.  Other Topics Concern   Not on file  Social History Narrative   Not on file   Social Drivers of Health   Tobacco Use: Medium Risk (12/07/2024)   Patient History    Smoking Tobacco Use: Former    Smokeless Tobacco Use: Never    Passive Exposure: Not on file  Financial Resource Strain: Low Risk (12/07/2024)   Overall Financial Resource Strain (CARDIA)    Difficulty of Paying Living Expenses: Not hard at all  Food Insecurity: No Food Insecurity (12/07/2024)   Epic    Worried About Programme Researcher, Broadcasting/film/video in the Last Year: Never true    Ran Out of Food in the Last Year: Never true  Transportation Needs: No Transportation Needs (12/07/2024)   Epic    Lack of Transportation (Medical): No    Lack of Transportation (Non-Medical): No  Physical Activity: Sufficiently Active (12/07/2024)   Exercise Vital Sign    Days of Exercise per Week: 4 days     Minutes of Exercise per Session: 60 min  Stress: No Stress Concern Present (12/07/2024)   [employer] of Occupational Health - Occupational Stress Questionnaire    Feeling of Stress: Only a little  Social Connections: Moderately Integrated (12/07/2024)   Social Connection and Isolation Panel    Frequency of Communication with Friends and Family: More than three times a week    Frequency of Social Gatherings with Friends and Family: Once a week    Attends Religious Services: More than 4 times per year    Active Member of Clubs or Organizations: No    Attends Banker Meetings: Never    Marital Status: Living with partner  Depression (PHQ2-9): Low Risk (12/07/2024)   Depression (PHQ2-9)    PHQ-2 Score: 0  Alcohol Screen: Low Risk (12/07/2024)   Alcohol Screen    Last Alcohol Screening Score (AUDIT): 0  Housing: Low Risk (12/07/2024)   Epic    Unable to Pay for Housing in the Last Year: No    Number of Times Moved in the Last Year: 0    Homeless in the Last Year: No  Utilities: Not At Risk (12/07/2024)   Epic    Threatened with loss of utilities: No  Health Literacy: Adequate Health Literacy (12/07/2024)   B1300 Health Literacy    Frequency of need for help with medical instructions: Never   Past Medical History:  Diagnosis Date   Acute medial meniscal tear 10/02/2020   Agatston coronary artery calcium  score less than 100 12/10/2019   Allergy    Anxiety 10/04/2016   Arthritis    Atherosclerosis    Atypical chest pain 06/24/2019   Bilateral carpal tunnel syndrome 01/30/2021   Bursitis of shoulder, left 04/19/2021   Cellulitis 05/22/2021   Coronary artery calcification 12/10/2019   DDD (degenerative disc disease), lumbar 09/03/2016   Encounter for screening mammogram for malignant neoplasm of breast 08/24/2020   Essential hypertension 07/18/2016   Ex-smoker 12/10/2019   Frequent headaches 08/24/2020   Gastroesophageal reflux disease without esophagitis  07/18/2016   Hand numbness 01/30/2021   Incidental lung nodule 08/21/2021   Intractable migraine with aura without status migrainosus 07/18/2016   Menorrhagia with regular cycle 07/18/2016   Migraines    Mixed dyslipidemia 12/10/2019   Pulmonary nodules/lesions, multiple 11/23/2019   Sleep apnea    on CPAP   Past Surgical History:  Procedure  Laterality Date   CARPAL TUNNEL RELEASE Right 07/02/2021   CARPAL TUNNEL RELEASE  12/14/2021   CHOLECYSTECTOMY  2005   TUBAL LIGATION  2006    Family History  Problem Relation Age of Onset   Diabetes Mother    Hypertension Mother    Colon polyps Mother    Irritable bowel syndrome Mother    Kidney disease Mother    Arthritis Mother    Heart Problems Father    Heart disease Father    Heart attack Maternal Grandfather    Heart disease Maternal Grandfather    Heart attack Paternal Grandmother    Lung cancer Paternal Grandfather    Stomach cancer Neg Hx    Colon cancer Neg Hx     ROS CONSTITUTIONAL: Negative for chills, fatigue, fever, unintentional weight gain and unintentional weight loss.  E/N/T: Negative for ear pain, nasal congestion and sore throat.  CARDIOVASCULAR: Negative for chest pain, dizziness, palpitations and pedal edema.  RESPIRATORY: Negative for recent cough and dyspnea.  GASTROINTESTINAL: Negative for abdominal pain, acid reflux symptoms, constipation, diarrhea, nausea and vomiting.  MSK: Negative for arthralgias and myalgias.  INTEGUMENTARY: Negative for rash.  NEUROLOGICAL: Negative for dizziness and headaches.  PSYCHIATRIC: Negative for sleep disturbance and to question depression screen.  Negative for depression, negative for anhedonia.   Objective:  PHYSICAL EXAM:   BP 112/80   Pulse 85   Temp 97.8 F (36.6 C) (Temporal)   Resp 18   Ht 5' 8.5 (1.74 m)   Wt 252 lb (114.3 kg)   SpO2 93%   BMI 37.76 kg/m   [place] CMA chaperone GEN: Well nourished, well developed, in no acute distress  HEENT:  normal external ears and nose - normal external auditory canals and TMS - hearing grossly normal - normal nasal mucosa and septum - Lips, Teeth and Gums - normal  Oropharynx - normal mucosa, palate, and posterior pharynx Neck: no JVD or masses - no thyromegaly Cardiac: RRR; no murmurs, rubs, or gallops,no edema - no significant varicosities Respiratory:  normal respiratory rate and pattern with no distress - normal breath sounds with no rales, rhonchi, wheezes or rubs GI: normal bowel sounds, no masses or tenderness GU - normal external genitalia - cervix normal - pap obtained - bimanual exam normal MS: no deformity or atrophy  Skin: warm and dry, no rash  Neuro:  Alert and Oriented x 3, Strength and sensation are intact - CN II-Xii grossly intact Psych: euthymic mood, appropriate affect and demeanor Office Visit on 12/07/2024  Component Date Value Ref Range Status   Color, UA 12/07/2024 yellow  yellow Final   Clarity, UA 12/07/2024 cloudy (A)  clear Final   Glucose, UA 12/07/2024 negative  negative mg/dL Final   Bilirubin, UA 87/83/7974 negative  negative Final   Ketones, POC UA 12/07/2024 negative  negative mg/dL Final   Spec Grav, UA 87/83/7974 1.025  1.010 - 1.025 Final   Blood, UA 12/07/2024 moderate (A)  negative Final   pH, UA 12/07/2024 5.5  5.0 - 8.0 Final   POC PROTEIN,UA 12/07/2024 negative  negative, trace Final   Urobilinogen, UA 12/07/2024 0.2  0.2 or 1.0 E.U./dL Final   Nitrite, UA 87/83/7974 Negative  Negative Final   Leukocytes, UA 12/07/2024 Trace (A)  Negative Final    Assessment & Plan:  Annual physical exam -     POCT URINALYSIS DIP (CLINITEK) -     CBC with Differential/Platelet -     Comprehensive metabolic panel with  GFR -     TSH -     Lipid panel -     Hemoglobin A1c -     IGP, Aptima HPV, rfx 16/18,45  Prediabetes -     Hemoglobin A1c  Essential hypertension -     POCT URINALYSIS DIP (CLINITEK)  Asymptomatic microscopic hematuria -     Urine  Culture    This is a list of the screening recommended for you and due dates:  Health Maintenance  Topic Date Due   Pap Smear  09/30/2024   Flu Shot  03/22/2025*   Breast Cancer Screening  08/20/2025   Colon Cancer Screening  06/14/2029   DTaP/Tdap/Td vaccine (2 - Td or Tdap) 05/23/2031   Pneumococcal Vaccine  Aged Out   Meningitis B Vaccine  Aged Out   Hepatitis B Vaccine  Discontinued   HPV Vaccine  Discontinued   COVID-19 Vaccine  Discontinued   Hepatitis C Screening  Discontinued   HIV Screening  Discontinued  *Topic was postponed. The date shown is not the original due date.     Follow-up: Return in about 6 months (around 06/07/2025) for chronic fasting follow-up and i9n 3 weeks repeat ua nurse visit.  An After Visit Summary was printed and given to the patient.  [facility] [phone]

## 2024-12-08 ENCOUNTER — Ambulatory Visit: Payer: Self-pay | Admitting: Physician Assistant

## 2024-12-09 LAB — IGP, APTIMA HPV, RFX 16/18,45
HPV Aptima: NEGATIVE
PAP Smear Comment: 0

## 2024-12-09 LAB — URINE CULTURE

## 2024-12-28 ENCOUNTER — Ambulatory Visit (INDEPENDENT_AMBULATORY_CARE_PROVIDER_SITE_OTHER): Admitting: Podiatry

## 2024-12-28 ENCOUNTER — Encounter: Payer: Self-pay | Admitting: Podiatry

## 2024-12-28 ENCOUNTER — Ambulatory Visit (INDEPENDENT_AMBULATORY_CARE_PROVIDER_SITE_OTHER): Payer: Self-pay

## 2024-12-28 DIAGNOSIS — R8290 Unspecified abnormal findings in urine: Secondary | ICD-10-CM

## 2024-12-28 DIAGNOSIS — L603 Nail dystrophy: Secondary | ICD-10-CM | POA: Diagnosis not present

## 2024-12-28 LAB — POCT URINALYSIS DIP (CLINITEK)
Bilirubin, UA: NEGATIVE
Blood, UA: NEGATIVE
Glucose, UA: NEGATIVE mg/dL
Ketones, POC UA: NEGATIVE mg/dL
Leukocytes, UA: NEGATIVE
Nitrite, UA: NEGATIVE
Spec Grav, UA: 1.025
Urobilinogen, UA: 0.2 U/dL
pH, UA: 6

## 2024-12-28 NOTE — Progress Notes (Signed)
 Patient is in office today for a nurse visit for Repeat UA. Patient UA was  negative for all components.   Spoke with Ginnie, provider stated urine looks great, patient is good to go.  Spoke with patient, verbalized understanding and had no questions at this time.

## 2024-12-28 NOTE — Patient Instructions (Signed)
 Look for urea 40% nail gel, cream or ointment and apply to the thickened toenails. This can be bought over the counter, at a pharmacy or online such as [employer].  OR:  You can combine 3 tablespoons of coconut oil and 10-15 drops of tea tree oil together and apply to the toenails once a day.

## 2024-12-28 NOTE — Progress Notes (Signed)
 "      Chief Complaint  Patient presents with   Nail Problem    Right hallux nail is thick and dis colored. She had it removed a little over a year ago, truma from gel polish    HPI: 45 y.o. female presents today following up for dystrophic right first toenail.  She would like to discuss various treatment options for this.  She did have a temporary total nail avulsion performed back in September 2024 which healed uneventfully however the nail did again grow out dystrophic.  She does report that sometimes falls off on its own.  Past Medical History:  Diagnosis Date   Acute medial meniscal tear 10/02/2020   Agatston coronary artery calcium  score less than 100 12/10/2019   Allergy    Anxiety 10/04/2016   Arthritis    Atherosclerosis    Atypical chest pain 06/24/2019   Bilateral carpal tunnel syndrome 01/30/2021   Bursitis of shoulder, left 04/19/2021   Cellulitis 05/22/2021   Coronary artery calcification 12/10/2019   DDD (degenerative disc disease), lumbar 09/03/2016   Encounter for screening mammogram for malignant neoplasm of breast 08/24/2020   Essential hypertension 07/18/2016   Ex-smoker 12/10/2019   Frequent headaches 08/24/2020   Gastroesophageal reflux disease without esophagitis 07/18/2016   Hand numbness 01/30/2021   Incidental lung nodule 08/21/2021   Intractable migraine with aura without status migrainosus 07/18/2016   Menorrhagia with regular cycle 07/18/2016   Migraines    Mixed dyslipidemia 12/10/2019   Pulmonary nodules/lesions, multiple 11/23/2019   Sleep apnea    on CPAP    Past Surgical History:  Procedure Laterality Date   CARPAL TUNNEL RELEASE Right 07/02/2021   CARPAL TUNNEL RELEASE  12/14/2021   CHOLECYSTECTOMY  2005   TUBAL LIGATION  2006    Allergies[1]  ROS    Physical Exam: There were no vitals filed for this visit.  General: The patient is alert and oriented x3 in no acute distress.  Dermatology: Skin is warm, dry and supple  bilateral lower extremities. Interspaces are clear of maceration and debris.  Right hallux nail plate is thickened and dystrophic.  Vascular: Palpable pedal pulses bilaterally. Capillary refill within normal limits.  No appreciable edema.  No erythema or calor.  Neurological: Light touch sensation grossly intact bilateral feet.   Musculoskeletal Exam: No pedal deformities noted  Assessment/Plan of Care: 1. Nail dystrophy      No orders of the defined types were placed in this encounter.  None  Discussed clinical findings with patient today.  Discussed at this point as temporary total nail avulsion had been performed and it did grow out dystrophic very limited conservative treatment options which may result in improvement of the nail.  Did recommend total nail avulsion with chemical matrixectomy.  She is deferring this for now but will consider this going forward.  Did advise that she can try various topical treatments such as Vicks vapor rub, tea tree oil mixed with coconut oil or urea-based nail gel which may soften up the nail make it easier for the patient to maintain.  It does cause her pain and discomfort with shoe gear.  Follow up as needed.   Hensley Aziz L. Lamount MAUL, AACFAS [facility]     [address]  [place], [place] [address]                Office [phone]  Fax [phone]      [1]  Allergies Allergen Reactions   Morphine And Codeine Shortness Of Breath and Nausea And Vomiting   Shellfish Allergy Anaphylaxis, Itching, Nausea And Vomiting and Shortness Of Breath   Clindamycin Hives   "

## 2024-12-30 ENCOUNTER — Ambulatory Visit: Payer: Self-pay | Admitting: Physician Assistant

## 2024-12-30 ENCOUNTER — Encounter: Payer: Self-pay | Admitting: Physician Assistant

## 2024-12-30 ENCOUNTER — Ambulatory Visit (HOSPITAL_BASED_OUTPATIENT_CLINIC_OR_DEPARTMENT_OTHER)
Admission: RE | Admit: 2024-12-30 | Discharge: 2024-12-30 | Disposition: A | Discharge status: Home / Self Care | Source: Ambulatory Visit | Attending: Physician Assistant | Admitting: Physician Assistant

## 2024-12-30 ENCOUNTER — Other Ambulatory Visit: Payer: Self-pay | Admitting: Physician Assistant

## 2024-12-30 VITALS — BP 114/80 | HR 82 | Temp 98.0°F | Resp 18 | Ht 68.5 in | Wt 250.2 lb

## 2024-12-30 DIAGNOSIS — R0789 Other chest pain: Secondary | ICD-10-CM

## 2024-12-30 DIAGNOSIS — J189 Pneumonia, unspecified organism: Secondary | ICD-10-CM

## 2024-12-30 DIAGNOSIS — R9389 Abnormal findings on diagnostic imaging of other specified body structures: Secondary | ICD-10-CM

## 2024-12-30 DIAGNOSIS — R911 Solitary pulmonary nodule: Secondary | ICD-10-CM

## 2024-12-30 MED ORDER — DOXYCYCLINE HYCLATE 100 MG PO TABS: 100.0000 mg | ORAL_TABLET | Freq: Two times a day (BID) | ORAL | 0 refills | Status: DC

## 2024-12-30 MED ORDER — PREDNISONE 20 MG PO TABS: ORAL_TABLET | ORAL | 0 refills | Status: DC

## 2024-12-30 MED ORDER — ALBUTEROL SULFATE HFA 108 (90 BASE) MCG/ACT IN AERS: 2.0000 | INHALATION_SPRAY | Freq: Four times a day (QID) | RESPIRATORY_TRACT | 0 refills | Status: DC | PRN

## 2024-12-30 NOTE — Progress Notes (Signed)
 "  Acute Office Visit  Subjective:    Patient ID: Marie Cox, female    DOB: 07-10-1981, 44 y.o.   MRN: [id number]  Chief Complaint  Patient presents with   Chest wall pain    HPI: Patient is in today for complaints of chest wall pain in lower ribs - she states it is the same type pain she had when diagnosed with pneumonia 2 months ago.  She said at that time after treatment she got better but about 1-2 weeks ago she started having productive cough and wheezing again with soreness in lower chest with coughing and movement  Current Medications[1]  Allergies[2]  ROS CONSTITUTIONAL: Negative for chills, fatigue, fever,  E/N/T: has had mild congestion CARDIOVASCULAR: Negative for chest pain, dizziness, palpitations and pedal edema.  RESPIRATORY: see HPI GASTROINTESTINAL: Negative for abdominal pain, acid reflux symptoms, constipation, diarrhea, nausea and vomiting.  MSK: see HPI     Objective:    PHYSICAL EXAM:   BP 114/80   Pulse 82   Temp 98 F (36.7 C) (Temporal)   Resp 18   Ht 5' 8.5 (1.74 m)   Wt 250 lb 3.2 oz (113.5 kg)   SpO2 95%   BMI 37.49 kg/m    GEN: Well nourished, well developed, in no acute distress  HEENT: normal external ears and nose - normal external auditory canals and TMS -- Lips, Teeth and Gums - normal  Oropharynx - erythema/pnd Cardiac: RRR; no murmurs, rubs,  Respiratory:  scattered rhonchi and wheezes noted MS: no deformity or atrophy  Skin: warm and dry, no rash        Assessment & Plan Chest wall pain  Orders:   DG Chest 2 View; Future  Pneumonia of both lower lobes due to infectious organism  Orders:   DG Chest 2 View; Future   predniSONE  (DELTASONE ) 20 MG tablet; 1 po tid for 3 days then 1 po bid for 3 days then 1 po qd for 3 days   doxycycline  (VIBRA -TABS) 100 MG tablet; Take 1 tablet (100 mg total) by mouth 2 (two) times daily.   albuterol  (VENTOLIN  HFA) 108 (90 Base) MCG/ACT inhaler; Inhale 2 puffs into the lungs  every 6 (six) hours as needed for wheezing or shortness of breath.    Follow-up: No follow-ups on file.  An After Visit Summary was printed and given to the patient.  SARA R Scout Guyett, PA-C [facility] [phone]     [1]  Current Outpatient Medications:    albuterol  (VENTOLIN  HFA) 108 (90 Base) MCG/ACT inhaler, Inhale 2 puffs into the lungs every 6 (six) hours as needed for wheezing or shortness of breath., Disp: 8 g, Rfl: 0   aspirin EC 81 MG tablet, Take 81 mg by mouth daily., Disp: , Rfl:    atorvastatin  (LIPITOR) 20 MG tablet, TAKE 1 TABLET DAILY, Disp: 90 tablet, Rfl: 2   cyclobenzaprine  (FLEXERIL ) 5 MG tablet, Take 1 tablet (5 mg total) by mouth 3 (three) times daily as needed for muscle spasms., Disp: 30 tablet, Rfl: 0   diclofenac (VOLTAREN) 50 MG EC tablet, Take by mouth., Disp: , Rfl:    doxycycline  (VIBRA -TABS) 100 MG tablet, Take 1 tablet (100 mg total) by mouth 2 (two) times daily., Disp: 20 tablet, Rfl: 0   DULoxetine  (CYMBALTA ) 30 MG capsule, Take 1 capsule by mouth once daily., Disp: 30 capsule, Rfl: 0   levocetirizine (XYZAL ALLERGY 24HR) 5 MG tablet, Take 5 mg by mouth daily., Disp: ,  Rfl:    linaclotide  (LINZESS ) 72 MCG capsule, Take 1 capsule (72 mcg total) by mouth daily before breakfast. Take 30 minutes before a meal, Disp: 30 capsule, Rfl: 11   lisinopril  (ZESTRIL ) 10 MG tablet, Take 1 tablet (10 mg total) by mouth daily., Disp: 90 tablet, Rfl: 1   metroNIDAZOLE  (METROGEL ) 0.75 % gel, Apply 1 Application topically 2 (two) times daily., Disp: 45 g, Rfl: 1   pantoprazole  (PROTONIX ) 40 MG tablet, Take 1 tablet (40 mg total) by mouth daily., Disp: 90 tablet, Rfl: 3   predniSONE  (DELTASONE ) 20 MG tablet, 1 po tid for 3 days then 1 po bid for 3 days then 1 po qd for 3 days, Disp: 18 tablet, Rfl: 0   traMADol (ULTRAM) 50 MG tablet, Take 50 mg by mouth 2 (two) times daily as needed., Disp: , Rfl:  [2]  Allergies Allergen Reactions   Morphine And Codeine  Shortness Of Breath and Nausea And Vomiting   Shellfish Allergy Anaphylaxis, Itching, Nausea And Vomiting and Shortness Of Breath   Clindamycin Hives   "

## 2025-01-03 ENCOUNTER — Other Ambulatory Visit: Payer: Self-pay | Admitting: Physician Assistant

## 2025-01-03 DIAGNOSIS — G8929 Other chronic pain: Secondary | ICD-10-CM

## 2025-01-03 DIAGNOSIS — K219 Gastro-esophageal reflux disease without esophagitis: Secondary | ICD-10-CM

## 2025-01-03 DIAGNOSIS — E782 Mixed hyperlipidemia: Secondary | ICD-10-CM

## 2025-01-03 DIAGNOSIS — J189 Pneumonia, unspecified organism: Secondary | ICD-10-CM

## 2025-01-03 DIAGNOSIS — I10 Essential (primary) hypertension: Secondary | ICD-10-CM

## 2025-01-03 DIAGNOSIS — R0789 Other chest pain: Secondary | ICD-10-CM

## 2025-01-03 DIAGNOSIS — F419 Anxiety disorder, unspecified: Secondary | ICD-10-CM

## 2025-01-03 MED ORDER — ALBUTEROL SULFATE HFA 108 (90 BASE) MCG/ACT IN AERS: 2.0000 | INHALATION_SPRAY | Freq: Four times a day (QID) | RESPIRATORY_TRACT | 0 refills | Status: AC | PRN

## 2025-01-03 MED ORDER — PANTOPRAZOLE SODIUM 40 MG PO TBEC: 40.0000 mg | DELAYED_RELEASE_TABLET | Freq: Every day | ORAL | 0 refills | Status: AC

## 2025-01-03 MED ORDER — DULOXETINE HCL 30 MG PO CPEP: 30.0000 mg | ORAL_CAPSULE | Freq: Every day | ORAL | 0 refills | Status: AC

## 2025-01-03 MED ORDER — LISINOPRIL 10 MG PO TABS: 10.0000 mg | ORAL_TABLET | Freq: Every day | ORAL | 0 refills | Status: AC

## 2025-01-03 MED ORDER — ATORVASTATIN CALCIUM 20 MG PO TABS: 20.0000 mg | ORAL_TABLET | Freq: Every day | ORAL | 0 refills | Status: AC

## 2025-01-06 ENCOUNTER — Encounter: Payer: Self-pay | Admitting: Physician Assistant

## 2025-01-11 ENCOUNTER — Other Ambulatory Visit: Payer: Self-pay

## 2025-01-11 ENCOUNTER — Other Ambulatory Visit: Payer: Self-pay | Admitting: Physician Assistant

## 2025-01-11 MED ORDER — TRAMADOL HCL 50 MG PO TABS: 50.0000 mg | ORAL_TABLET | Freq: Two times a day (BID) | ORAL | 0 refills | Status: AC | PRN

## 2025-01-12 ENCOUNTER — Other Ambulatory Visit (HOSPITAL_COMMUNITY): Payer: Self-pay

## 2025-01-12 ENCOUNTER — Telehealth: Payer: Self-pay

## 2025-01-12 NOTE — Telephone Encounter (Signed)
 Pharmacy Patient Advocate Encounter   Received notification from [facility] [employer] that prior authorization for Tramadol  is required/requested.   Insurance verification completed.   The patient is insured through [facility] MEDICAID.   Per test claim: PA required; PA submitted to above mentioned insurance via Latent Key/confirmation #/EOC [facility] Status is pending

## 2025-01-13 NOTE — Telephone Encounter (Signed)
 Pharmacy Patient Advocate Encounter  Received notification from [facility] [employer] that Prior Authorization for Tramadol  has been DENIED.  Full denial letter will be uploaded to the media tab. See denial reason below.   PA #/Case ID/Reference #: [id number]

## 2025-01-18 ENCOUNTER — Inpatient Hospital Stay (HOSPITAL_BASED_OUTPATIENT_CLINIC_OR_DEPARTMENT_OTHER)
Admission: RE | Admit: 2025-01-18 | Discharge: 2025-01-18 | Attending: Physician Assistant | Admitting: Physician Assistant

## 2025-01-18 ENCOUNTER — Inpatient Hospital Stay (HOSPITAL_BASED_OUTPATIENT_CLINIC_OR_DEPARTMENT_OTHER): Admission: RE | Admit: 2025-01-18 | Source: Ambulatory Visit | Admitting: Radiology

## 2025-01-18 DIAGNOSIS — R9389 Abnormal findings on diagnostic imaging of other specified body structures: Secondary | ICD-10-CM

## 2025-01-18 DIAGNOSIS — R911 Solitary pulmonary nodule: Secondary | ICD-10-CM

## 2025-01-18 MED ORDER — IOHEXOL 300 MG/ML  SOLN
60.0000 mL | Freq: Once | INTRAMUSCULAR | Status: AC | PRN
  Administered 2025-01-18: 60 mL via INTRAVENOUS

## 2025-01-20 ENCOUNTER — Other Ambulatory Visit: Payer: Self-pay | Admitting: Physician Assistant

## 2025-01-20 ENCOUNTER — Ambulatory Visit: Payer: Self-pay | Admitting: Physician Assistant

## 2025-01-20 MED ORDER — LEVOFLOXACIN 500 MG PO TABS: 500.0000 mg | ORAL_TABLET | Freq: Every day | ORAL | 0 refills | Status: AC

## 2025-01-20 MED ORDER — PREDNISONE 20 MG PO TABS: ORAL_TABLET | ORAL | 0 refills | Status: AC

## 2025-02-03 ENCOUNTER — Ambulatory Visit: Admitting: Physician Assistant

## 2025-06-09 ENCOUNTER — Ambulatory Visit: Admitting: Physician Assistant
